# Patient Record
Sex: Female | Born: 1985 | Race: Black or African American | Hispanic: No | Marital: Married | Smoking: Never smoker
Health system: Southern US, Community
[De-identification: ages and names within clinical notes are randomized; demographics above are authoritative.]

## PROBLEM LIST (undated history)

## (undated) ENCOUNTER — Inpatient Hospital Stay (HOSPITAL_COMMUNITY): Payer: Private Health Insurance - Indemnity

## (undated) ENCOUNTER — Ambulatory Visit: Admission: EM | Discharge status: Absent without leave | Payer: BC Managed Care – PPO

## (undated) DIAGNOSIS — Z841 Family history of disorders of kidney and ureter: Secondary | ICD-10-CM

## (undated) DIAGNOSIS — Z823 Family history of stroke: Secondary | ICD-10-CM

## (undated) DIAGNOSIS — N809 Endometriosis, unspecified: Secondary | ICD-10-CM

## (undated) DIAGNOSIS — Z820 Family history of epilepsy and other diseases of the nervous system: Secondary | ICD-10-CM

## (undated) DIAGNOSIS — J45909 Unspecified asthma, uncomplicated: Secondary | ICD-10-CM

## (undated) DIAGNOSIS — Z8619 Personal history of other infectious and parasitic diseases: Secondary | ICD-10-CM

## (undated) DIAGNOSIS — Z8349 Family history of other endocrine, nutritional and metabolic diseases: Secondary | ICD-10-CM

## (undated) DIAGNOSIS — Z8249 Family history of ischemic heart disease and other diseases of the circulatory system: Secondary | ICD-10-CM

## (undated) DIAGNOSIS — R490 Dysphonia: Secondary | ICD-10-CM

## (undated) DIAGNOSIS — Z87440 Personal history of urinary (tract) infections: Secondary | ICD-10-CM

## (undated) DIAGNOSIS — I82401 Acute embolism and thrombosis of unspecified deep veins of right lower extremity: Secondary | ICD-10-CM

## (undated) DIAGNOSIS — K759 Inflammatory liver disease, unspecified: Secondary | ICD-10-CM

## (undated) DIAGNOSIS — Z8742 Personal history of other diseases of the female genital tract: Secondary | ICD-10-CM

## (undated) DIAGNOSIS — Z809 Family history of malignant neoplasm, unspecified: Secondary | ICD-10-CM

## (undated) DIAGNOSIS — Z8709 Personal history of other diseases of the respiratory system: Secondary | ICD-10-CM

## (undated) DIAGNOSIS — B379 Candidiasis, unspecified: Secondary | ICD-10-CM

## (undated) DIAGNOSIS — Z833 Family history of diabetes mellitus: Secondary | ICD-10-CM

## (undated) DIAGNOSIS — Z87442 Personal history of urinary calculi: Secondary | ICD-10-CM

## (undated) DIAGNOSIS — IMO0002 Reserved for concepts with insufficient information to code with codable children: Secondary | ICD-10-CM

## (undated) DIAGNOSIS — Z87448 Personal history of other diseases of urinary system: Secondary | ICD-10-CM

## (undated) DIAGNOSIS — R200 Anesthesia of skin: Secondary | ICD-10-CM

## (undated) HISTORY — DX: Personal history of urinary (tract) infections: Z87.440

## (undated) HISTORY — DX: Personal history of other infectious and parasitic diseases: Z86.19

## (undated) HISTORY — DX: Anesthesia of skin: R20.0

## (undated) HISTORY — PX: PERCUTANEOUS NEPHROSTOMY: SHX2208

## (undated) HISTORY — DX: Family history of stroke: Z82.3

## (undated) HISTORY — DX: Endometriosis, unspecified: N80.9

## (undated) HISTORY — DX: Personal history of other diseases of urinary system: Z87.448

## (undated) HISTORY — DX: Personal history of other diseases of the female genital tract: Z87.42

## (undated) HISTORY — DX: Unspecified asthma, uncomplicated: J45.909

## (undated) HISTORY — DX: Family history of epilepsy and other diseases of the nervous system: Z82.0

## (undated) HISTORY — DX: Dysphonia: R49.0

## (undated) HISTORY — DX: Candidiasis, unspecified: B37.9

## (undated) HISTORY — DX: Family history of diabetes mellitus: Z83.3

## (undated) HISTORY — DX: Inflammatory liver disease, unspecified: K75.9

## (undated) HISTORY — DX: Family history of malignant neoplasm, unspecified: Z80.9

## (undated) HISTORY — DX: Reserved for concepts with insufficient information to code with codable children: IMO0002

## (undated) HISTORY — DX: Family history of other endocrine, nutritional and metabolic diseases: Z83.49

## (undated) HISTORY — DX: Family history of disorders of kidney and ureter: Z84.1

## (undated) HISTORY — DX: Acute embolism and thrombosis of unspecified deep veins of right lower extremity: I82.401

## (undated) HISTORY — PX: WISDOM TOOTH EXTRACTION: SHX21

## (undated) HISTORY — DX: Personal history of other diseases of the respiratory system: Z87.09

## (undated) HISTORY — DX: Family history of ischemic heart disease and other diseases of the circulatory system: Z82.49

---

## 2003-09-29 DIAGNOSIS — Z8742 Personal history of other diseases of the female genital tract: Secondary | ICD-10-CM

## 2003-09-29 HISTORY — DX: Personal history of other diseases of the female genital tract: Z87.42

## 2003-12-23 ENCOUNTER — Emergency Department (HOSPITAL_COMMUNITY): Admission: AD | Admit: 2003-12-23 | Discharge: 2003-12-23 | Payer: Self-pay | Admitting: Internal Medicine

## 2003-12-25 ENCOUNTER — Emergency Department (HOSPITAL_COMMUNITY): Admission: EM | Admit: 2003-12-25 | Discharge: 2003-12-25 | Payer: Self-pay | Admitting: Family Medicine

## 2004-07-04 ENCOUNTER — Other Ambulatory Visit: Admission: RE | Admit: 2004-07-04 | Discharge: 2004-07-04 | Payer: Self-pay | Admitting: Obstetrics and Gynecology

## 2004-10-21 ENCOUNTER — Emergency Department (HOSPITAL_COMMUNITY): Admission: EM | Admit: 2004-10-21 | Discharge: 2004-10-21 | Payer: Self-pay | Admitting: Family Medicine

## 2004-10-30 ENCOUNTER — Emergency Department (HOSPITAL_COMMUNITY): Admission: EM | Admit: 2004-10-30 | Discharge: 2004-10-30 | Payer: Self-pay | Admitting: Family Medicine

## 2005-02-08 ENCOUNTER — Emergency Department (HOSPITAL_COMMUNITY): Admission: EM | Admit: 2005-02-08 | Discharge: 2005-02-08 | Payer: Self-pay | Admitting: Emergency Medicine

## 2005-09-04 ENCOUNTER — Emergency Department (HOSPITAL_COMMUNITY): Admission: EM | Admit: 2005-09-04 | Discharge: 2005-09-04 | Payer: Self-pay | Admitting: Emergency Medicine

## 2005-09-28 DIAGNOSIS — Z8709 Personal history of other diseases of the respiratory system: Secondary | ICD-10-CM

## 2005-09-28 DIAGNOSIS — I82401 Acute embolism and thrombosis of unspecified deep veins of right lower extremity: Secondary | ICD-10-CM

## 2005-09-28 DIAGNOSIS — IMO0002 Reserved for concepts with insufficient information to code with codable children: Secondary | ICD-10-CM

## 2005-09-28 HISTORY — DX: Personal history of other diseases of the respiratory system: Z87.09

## 2005-09-28 HISTORY — DX: Acute embolism and thrombosis of unspecified deep veins of right lower extremity: I82.401

## 2005-09-28 HISTORY — DX: Reserved for concepts with insufficient information to code with codable children: IMO0002

## 2005-12-07 ENCOUNTER — Emergency Department (HOSPITAL_COMMUNITY): Admission: EM | Admit: 2005-12-07 | Discharge: 2005-12-07 | Payer: Self-pay | Admitting: *Deleted

## 2006-03-25 ENCOUNTER — Other Ambulatory Visit: Admission: RE | Admit: 2006-03-25 | Discharge: 2006-03-25 | Payer: Self-pay | Admitting: Obstetrics and Gynecology

## 2006-04-21 ENCOUNTER — Inpatient Hospital Stay (HOSPITAL_COMMUNITY): Admission: AD | Admit: 2006-04-21 | Discharge: 2006-04-22 | Payer: Self-pay | Admitting: Obstetrics and Gynecology

## 2006-04-29 ENCOUNTER — Inpatient Hospital Stay (HOSPITAL_COMMUNITY): Admission: AD | Admit: 2006-04-29 | Discharge: 2006-05-18 | Payer: Self-pay | Admitting: Obstetrics and Gynecology

## 2006-04-30 ENCOUNTER — Encounter (INDEPENDENT_AMBULATORY_CARE_PROVIDER_SITE_OTHER): Payer: Self-pay | Admitting: Specialist

## 2006-04-30 ENCOUNTER — Ambulatory Visit: Payer: Self-pay | Admitting: Pulmonary Disease

## 2006-04-30 ENCOUNTER — Ambulatory Visit: Payer: Self-pay | Admitting: Cardiology

## 2006-05-03 ENCOUNTER — Encounter: Payer: Self-pay | Admitting: General Surgery

## 2006-05-20 ENCOUNTER — Ambulatory Visit: Payer: Self-pay | Admitting: Cardiology

## 2006-05-27 ENCOUNTER — Ambulatory Visit: Payer: Self-pay | Admitting: Cardiology

## 2006-05-29 DIAGNOSIS — Z8619 Personal history of other infectious and parasitic diseases: Secondary | ICD-10-CM

## 2006-05-29 HISTORY — DX: Personal history of other infectious and parasitic diseases: Z86.19

## 2006-06-07 ENCOUNTER — Ambulatory Visit: Payer: Self-pay | Admitting: Emergency Medicine

## 2006-06-10 ENCOUNTER — Ambulatory Visit: Payer: Self-pay | Admitting: Cardiology

## 2006-06-24 ENCOUNTER — Ambulatory Visit: Payer: Self-pay | Admitting: Cardiology

## 2006-07-08 ENCOUNTER — Ambulatory Visit: Payer: Self-pay | Admitting: Cardiology

## 2006-07-15 ENCOUNTER — Ambulatory Visit: Payer: Self-pay | Admitting: Emergency Medicine

## 2006-07-16 ENCOUNTER — Ambulatory Visit: Payer: Self-pay | Admitting: Cardiology

## 2006-08-02 ENCOUNTER — Ambulatory Visit: Payer: Self-pay

## 2006-08-02 ENCOUNTER — Ambulatory Visit: Payer: Self-pay | Admitting: Internal Medicine

## 2006-08-02 ENCOUNTER — Encounter: Payer: Self-pay | Admitting: Internal Medicine

## 2006-08-16 ENCOUNTER — Ambulatory Visit: Payer: Self-pay | Admitting: Emergency Medicine

## 2006-08-23 ENCOUNTER — Ambulatory Visit: Payer: Self-pay | Admitting: Cardiology

## 2006-09-24 ENCOUNTER — Ambulatory Visit: Payer: Self-pay | Admitting: Cardiology

## 2006-09-29 ENCOUNTER — Ambulatory Visit: Payer: Self-pay | Admitting: Internal Medicine

## 2006-10-08 ENCOUNTER — Ambulatory Visit: Payer: Self-pay | Admitting: Internal Medicine

## 2006-10-29 ENCOUNTER — Ambulatory Visit: Payer: Self-pay | Admitting: Internal Medicine

## 2006-11-19 ENCOUNTER — Ambulatory Visit: Payer: Self-pay | Admitting: Internal Medicine

## 2006-11-22 ENCOUNTER — Ambulatory Visit: Payer: Self-pay | Admitting: Emergency Medicine

## 2007-05-11 ENCOUNTER — Emergency Department (HOSPITAL_COMMUNITY): Admission: EM | Admit: 2007-05-11 | Discharge: 2007-05-11 | Payer: Self-pay | Admitting: Family Medicine

## 2007-05-24 DIAGNOSIS — R200 Anesthesia of skin: Secondary | ICD-10-CM

## 2007-05-24 HISTORY — DX: Anesthesia of skin: R20.0

## 2007-06-10 ENCOUNTER — Ambulatory Visit: Payer: Self-pay | Admitting: Vascular Surgery

## 2007-07-27 DIAGNOSIS — I82409 Acute embolism and thrombosis of unspecified deep veins of unspecified lower extremity: Secondary | ICD-10-CM | POA: Insufficient documentation

## 2007-07-27 DIAGNOSIS — J984 Other disorders of lung: Secondary | ICD-10-CM

## 2008-07-17 ENCOUNTER — Inpatient Hospital Stay (HOSPITAL_COMMUNITY): Admission: AD | Admit: 2008-07-17 | Discharge: 2008-07-17 | Payer: Self-pay | Admitting: Obstetrics and Gynecology

## 2008-08-09 ENCOUNTER — Inpatient Hospital Stay (HOSPITAL_COMMUNITY): Admission: AD | Admit: 2008-08-09 | Discharge: 2008-08-09 | Payer: Self-pay | Admitting: Obstetrics and Gynecology

## 2008-08-19 ENCOUNTER — Inpatient Hospital Stay (HOSPITAL_COMMUNITY): Admission: AD | Admit: 2008-08-19 | Discharge: 2008-08-19 | Payer: Self-pay | Admitting: Obstetrics and Gynecology

## 2008-09-13 ENCOUNTER — Inpatient Hospital Stay (HOSPITAL_COMMUNITY): Admission: AD | Admit: 2008-09-13 | Discharge: 2008-09-13 | Payer: Self-pay | Admitting: Obstetrics and Gynecology

## 2008-10-19 ENCOUNTER — Inpatient Hospital Stay (HOSPITAL_COMMUNITY): Admission: RE | Admit: 2008-10-19 | Discharge: 2008-10-21 | Payer: Self-pay | Admitting: Obstetrics and Gynecology

## 2008-12-12 LAB — CONVERTED CEMR LAB: Pap Smear: NORMAL

## 2009-09-03 ENCOUNTER — Ambulatory Visit: Payer: Self-pay | Admitting: Family

## 2009-09-03 DIAGNOSIS — K5289 Other specified noninfective gastroenteritis and colitis: Secondary | ICD-10-CM | POA: Insufficient documentation

## 2009-09-03 DIAGNOSIS — R51 Headache: Secondary | ICD-10-CM | POA: Insufficient documentation

## 2009-09-03 DIAGNOSIS — J45909 Unspecified asthma, uncomplicated: Secondary | ICD-10-CM | POA: Insufficient documentation

## 2009-09-03 DIAGNOSIS — R519 Headache, unspecified: Secondary | ICD-10-CM | POA: Insufficient documentation

## 2009-09-03 DIAGNOSIS — J309 Allergic rhinitis, unspecified: Secondary | ICD-10-CM | POA: Insufficient documentation

## 2010-09-10 ENCOUNTER — Telehealth: Payer: Self-pay | Admitting: Family

## 2010-10-19 ENCOUNTER — Encounter: Payer: Self-pay | Admitting: Obstetrics and Gynecology

## 2010-10-30 NOTE — Progress Notes (Signed)
  Phone Note Outgoing Call   Call placed by: Lemont Fillers FNP,  September 10, 2010 9:35 AM Call placed to: Patient Summary of Call: Called patient to follow up on her acute gastroenteritis. She states that she is feeling much better. Initial call taken by: Lemont Fillers FNP,  September 10, 2010 9:36 AM

## 2011-01-12 LAB — CBC
HCT: 29.9 % — ABNORMAL LOW (ref 36.0–46.0)
HCT: 34.5 % — ABNORMAL LOW (ref 36.0–46.0)
MCHC: 33.8 g/dL (ref 30.0–36.0)
MCV: 90.3 fL (ref 78.0–100.0)
Platelets: 152 10*3/uL (ref 150–400)
Platelets: 174 10*3/uL (ref 150–400)
RDW: 13.3 % (ref 11.5–15.5)
RDW: 13.3 % (ref 11.5–15.5)
WBC: 14.3 10*3/uL — ABNORMAL HIGH (ref 4.0–10.5)
WBC: 8.2 10*3/uL (ref 4.0–10.5)

## 2011-01-12 LAB — URINE CULTURE
Colony Count: NO GROWTH
Special Requests: NEGATIVE

## 2011-01-12 LAB — RH IMMUNE GLOB WKUP(>/=20WKS)(NOT WOMEN'S HOSP)

## 2011-01-12 LAB — RPR: RPR Ser Ql: NONREACTIVE

## 2011-02-10 NOTE — Consult Note (Signed)
NEW PATIENT CONSULTATION   DIANY, FORMOSA  DOB:  [birth year]                                       06/10/2007  ZOXWR#:[id number]   The patient presents today for evaluation of bilateral lower extremity  numbness.  She is a very pleasant 25 year old female who had a history  of DVT with complications surrounding the time of complications from  delivery of a preterm infant and secondary sepsis.  She had a negative  hypercoagulable workup at that time.  She reports having bilateral leg  numbness mostly around the time of menses.  She does not have any  swelling.  I do not have documentation of which leg had the DVT, but she  has been told that this was left leg, although  clinic notes from  the Coumadin clinic suggest right leg DVT.  She has had no prior  thrombotic events, and no thrombotic events since her severe illness  around the time of her delivery.  She has no other major medical  difficulties, and no history of premature atherosclerotic disease or  thrombotic events in her family.  She is single.  Works as a Public librarian.  Does not smoke or drink or alcohol.   REVIEW OF SYSTEMS:  Positive for bronchitis and wheezing.   PHYSICAL EXAMINATION:  She is a well-developed, thin black female,  appearing stated age of 48.  Blood pressure is 127/80, pulse 83,  respirations 16.  Her radial and dorsalis pedis pulses are 2+  bilaterally.  She does not have any evidence of swelling or evidence of  chronic venous insufficiency, and no skin changes.  She has normal  sensation currently.   She underwent noninvasive vascular laboratory studies in our office.  This showed no evidence of DVT.  No evidence of superficial  thrombophlebitis.  She does have mild reflux in her right great  saphenous vein.  I discussed this at length with [name].  I explained  that unfortunately she does not have any evidence of postphlebitic  syndrome or any evidence  of vein injury following her deep venous  thrombosis.  I explained that this should indicate no major risk for  history of postphlebitic syndrome in the future.  I am unclear it is  etiology of her numbness, but do not feel that there is any relationship  to any vascular or arterial pathology.  She was relieved with this  discussion, and will see [place] again on an as needed basis.   Larina Earthly, M.D.  Electronically Signed   TFE/MEDQ  D:  06/10/2007  T:  06/13/2007  Job:  [id number]   cc:   Hal Morales, M.D.

## 2011-02-10 NOTE — H&P (Signed)
NAMESYENNA, NAZIR                 ACCOUNT NO.:  [id number]   MEDICAL RECORD NO.:  [id number]          PATIENT TYPE:  INP   LOCATION:  9162                          FACILITY:  [facility]   PHYSICIAN:  Hal Morales, M.D.DATE OF BIRTH:  1985-11-21   DATE OF ADMISSION:  10/19/2008  DATE OF DISCHARGE:                              HISTORY & PHYSICAL   [name] is a 25 year old G2, P0-1-0-0 at 39 weeks and 5 days who  presents for induction of labor per [name].  She is followed by the  doctors at [facility].  Her pregnancy is remarkable for:   1. Rh negative.  2. Amoxicillin allergy.  3. Intrauterine fetal demise at 21 weeks secondary to septic shock.  4. Sepsis and renal failure with last pregnancy.  5. History of DVT, right lower extremity while hospitalized with      septic shock.  6. Anemia.  7. Childhood asthma.  She did receive H1N1 vaccine.  8. Low BMI with a total weight gain of 20 pounds this pregnancy.   PRENATAL LABS:  Include hemoglobin 11.1, hematocrit 33.4, platelets 258.  Blood type A negative.  Antibody screen negative.  Sickle cell trait  negative, RPR nonreactive, rubella immune, hepatitis B negative, HIV  nonreactive.  Negative multiple urine cultures.  There is no Pap on  record.  Chlamydia and gonorrhea negative on multiple cultures.  GBS  negative.  Cystic fibrosis screen negative.  Her Glucola screen was  negative and she had a negative F F in November.   HISTORY OF PRESENT PREGNANCY:  Ms [name] presented for her new OB  evaluation in early July at about 10-1/2 weeks.  At 13-1/2 weeks she had  a first trimester screen which was normal and was treated for yeast  infection with Terazol cream.  At 17-1/2 weeks she had an AFP which was  normal and negative Chem 10 on her urine.  At 19 weeks she was  complaining of calf pain and shortness of breath, had a normal exam per  Dr. Stefano Gaul.  She had an ultrasound which showed a single intrauterine  pregnancy, cervix 3.99 cm  long, normal anatomy and normal fluid.  She  had a gastrointestinal illness at 20 weeks for which she took some  Imodium.  At 21-1/2 weeks her leg pain was resolved.  A urine was sent  off for culture.  At 25-1/2 weeks she had a Glucola which was normal and  was found to have a hemoglobin of 10.2 at that time and was changed over  to prenatal vitamin with ferrous gluconate and iron supplementation  twice a day.  She was educated on iron-rich foods.  At 34 weeks she had  evaluation for SROM, which was negative; an ultrasound which showed  estimated fetal weight 5 pounds 2 ounces in the 57th percentile with  normal fluid.  Also at 34 weeks she got another episode of  gastroenteritis with complaints of diarrhea, another negative SROM 3  days after the first SROM evaluation, negative pooling, negative  Nitrazine, negative fern.  Cervix was closed and long, -3  posterior.  She did have a reactive NST and was contracting.  She was sent to the  hospital for terbutaline and monitoring and that was on December 17.  She got her RhoGAM and H1N1 vaccine at 27 weeks and she had a negative  fetal fibronectin test at 29 weeks.  At 36-5 weeks she had negative  Chlamydia, gonorrhea and GBS cultures, a negative urine culture.  At 37  weeks she had an upper respiratory infection which she treated with  Sudafed Delsym, saline rinses and Tylenol and was also given a  prescription for a Z-Pak.  She had a reactive NST that  week.  The last  2 weeks of her pregnancy have been unremarkable when she has been well.   CURRENT MEDICATIONS INCLUDE:  Flintstone vitamin daily as she cannot  tolerate the prenatals, and iron 2x daily.  She does have an inhaler,  Ventolin HFA,which she uses p.r.n., last use reported 3 weeks ago.   OB HISTORY:  Rhett Bannister 1 was an IUFD at 90 weeks in August, 2007, related  to septic shock.  The patient was in renal failure and on a ventilator.  Gravida 2 is current pregnancy.   ALLERGIES:   [name] is allergic AMOXICILLIN.   MEDICAL HISTORY:  The previously mentioned sepsis, renal failure, which  also involved a DVT and a fetal loss at 21 weeks.  She is A-negative for  blood type.  She has frequent yeast infections.  She did have chicken  pox as a child and the complete hepatitis vaccination series.  DVT,  right lower extremity in 2007.  She has been asthmatic since age 80 or 36.  She has had bronchitis.  She has history of cystitis and pyelonephritis  and resolved acute renal failure.   SURGICAL HISTORY:  Wisdom teeth extraction.   FAMILY HISTORY:  Maternal grandmother deceased secondary to MI.  Dad has  high cholesterol.  Her mom and her maternal grandfather have chronic  hypertension.  Her maternal grandmother has bronchitis, maternal  grandmother has diabetes.  Her maternal grandfather is on dialysis.  Her  maternal grandfather has had a stroke.  Her paternal grandfather had a  brain tumor which he died of.  Her father has a tobacco addiction.   GENETIC HISTORY:  The patient has negative screens for sickle trait and  cystic fibrosis.  The father of the baby has a first cousin with  cerebral palsy.  The patient has paternal aunt with Down syndrome.   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  Father of the baby,  Caprisha Bridgett, has 16-1/2 years of education and works at [employer].  She states her religion is [facility] and then she denies use of alcohol,  tobacco or street drugs during this pregnancy.   PHYSICAL EXAM:  Is normal today.  Afebrile, vital signs stable.  HEENT: Within normal limits.  LUNGS: Clear to auscultation bilaterally.  HEART: Regular rate and rhythm.  No murmurs.  BREASTS:  Soft and nontender.  ABDOMEN:  Gravid, soft to palpation, appropriate for gestational age.  EXTREMITIES: Without any edema.  DTRs within normal limits.  Negative  Homans x2.  Fetal heart rate 135,  reactive and reassuring.  Occasional rare  contractions upon admission.  Vaginal exam per RN Lillia Mountain:  4 cm, 80%  effaced, minus two station, vertex.   IMPRESSION:  A 25 year old G2, P 0-1-0-0  at 39.5 weeks.  History of intrauterine fetal demise, sepsis, renal failure at 21 weeks.  Rh-negative.   PLAN:  Admit for IOL per Pitocin protocol.  Urine for C&S and per orders  [name].  M.D. management.      Eulogio Bear, CNM      Hal Morales, M.D.  Electronically Signed    JM/MEDQ  D:  10/19/2008  T:  10/19/2008  Job:  [id number]

## 2011-02-10 NOTE — Procedures (Signed)
DUPLEX DEEP VENOUS EXAM - LOWER EXTREMITY   INDICATION:  Previous DVT in the right leg.   HISTORY:  Edema:  Mild bilateral lower extremity edema.  Trauma/Surgery:  The patient was on a respirator for a period of time  last year.  Pain:  The patient complains of bilateral leg numbness during her cycle.  PE:  No.  Previous DVT:  The patient had a lower extremity DVT in her right leg  last year, however, that report was unavailable.  Anticoagulants:  The patient was on Coumadin for six months last year.  She stopped taking Coumadin in February 2008.   DUPLEX EXAM:                CFV   SFV   PopV  PTV    GSV                R  L  R  L  R  L  R   L  R  L  Thrombosis    o  o  o  o  o  o  o   o  o  o  Spontaneous   +  +  +  +  +  +  +   +  +  +  Phasic        +  +  +  +  +  +  +   +  +  +  Augmentation  +  +  +  +  +  +  +   +  +  +  Compressible  +  +  +  +  +  +  +   +  +  +  Competent     +  +  +  +  +  +  +   +  P. +.   Legend:  + - yes  o - no  p - partial  D - decreased   IMPRESSION:  No evidence of a DVT, SVT or Baker's cyst bilaterally.   Mild reflux was seen in the right greater saphenous vein.    _____________________________  Larina Earthly, M.D.   MC/MEDQ  D:  06/10/2007  T:  06/11/2007  Job:  [id number]

## 2011-02-13 NOTE — Assessment & Plan Note (Signed)
[facility]                               PULMONARY OFFICE NOTE   JAZZLIN, CLEMENTS                        MRN:          [id number]  DATE:08/16/2006                            DOB:          1986-02-19    FOLLOWUP VISIT:   SUBJECTIVE:  [name] is a pleasant 25 year old woman who has a history of  a critical care illness in August of 2007 with septic shock complicated by  ARDS.  She also had a right lower extremity DVT, and has been on Coumadin  since that hospitalization.  She follows up today stating that she is  breathing fine.  She has no complaints.  She has been compliant with her  Coumadin, and it has been in the therapeutic range.  She gets her followup  at the [facility].   MEDICATIONS:  Coumadin 5 mg on Wednesdays and 7.5 mg on all other days.   PHYSICAL EXAMINATION:  GENERAL:  This is a pleasant young woman who is in no  distress on room air.  VITAL SIGNS:  Weight is 101 pounds, temperature 98.0, blood pressure 108/72,  heart rate 80, SpO2 of 98% on room air.  HEENT:  Benign.  Her hoarse voice has resolved.  LUNGS:  Clear to auscultation bilaterally.  HEART:  Regular rate and rhythm without murmur.  ABDOMEN:  Soft, benign, with positive bowel sounds.  EXTREMITIES:  No clubbing, cyanosis, or edema.   Transthoracic echocardiogram was performed on August 02, 2006.  This showed  normal left ventricular size and function with an estimated ejection  fraction of 65%.  Left ventricular wall thickness was normal.  The right-  sided function was normal, as well.  This was regarded as an improvement  compared with her hospitalization echocardiogram and shows no evidence of  postpartum or sepsis cardiomyopathy.  Chest x-ray performed today was within  normal limits without any evidence of infiltrate or effusion.  Her heart  shadow was normal in appearance.   IMPRESSION:  1. History of acute respiratory distress syndrome and  septic shock and      pyelonephritis in the setting of a 21-week intrauterine pregnancy, now      fully recovered.  2. Right lower extremity deep vein thrombosis on Coumadin.  She will need      to be on Coumadin for a total of 6 months.  3. No evidence of postpartum or septic shock related cardiomyopathy on      echocardiogram.  4. Intermittent hoarse voice status post intubation which has resolved.   PLANS:  1. [name] will continue her Coumadin until mid February 2008.  2. I will followup with [name] in February 2008 to discontinue her      Coumadin.     Leslye Peer, MD  Electronically Signed    RSB/MedQ  DD: 08/16/2006  DT: 08/16/2006  Job #: [id number]   cc:   Hal Morales, M.D.

## 2011-02-13 NOTE — Assessment & Plan Note (Signed)
[facility]                               PULMONARY OFFICE NOTE   AAYLA, MARROCCO                        MRN:          [id number]  DATE:06/07/2006                            DOB:          May 20, 1986    BRIEF HISTORY:  [name] is a 25 year old woman without any significant  past medical history who was admitted to [facility] in early August  2007, [redacted] weeks pregnant.  She was noted to have a urinary tract infection  with a sensitive E coli.  She, unfortunately, developed profound septic  shock, ARDS and respiratory failure, resulting in fetal demise. She was  aggressively resuscitated and moved to [facility]  for further  care.  She developed oliguric renal failure requiring hemodialysis in  addition to mechanical ventilation and hemodynamic support with multiple  pressors.  She slowly improved and these support mechanisms were able to be  weaned off.  Her hospital course was complicated by a right femoral DVT for  which she was started on low molecular weight heparin and then Coumadin. She  continues on Coumadin at this time.  There was also some question of  decreased left ventricular function during the hospitalization which was  ascribed to either mild cardiomyopathy associated with pregnancy versus  cardiomyopathy of septic shock.  She was discharged to home on May 18, 2006.  This is her first followup visit.   PAST MEDICAL HISTORY:  None.   ALLERGIES:  AMOXICILLIN.   CURRENT MEDICATIONS:  1. Coumadin as directed per the [facility].  2. Protonix 40 mg p.o. daily.   SOCIAL HISTORY:  The patient lives in [place].  She is a Consulting civil engineer at [employer].  Her parents have been involved in her care during the  hospitalization.  She does not smoke or use alcohol.   FAMILY HISTORY:  Noncontributory.   PHYSICAL EXAMINATION:  GENERAL:  This is a pleasant, thin woman who is in no  distress on room air.  VITAL SIGNS:  Weight 102 pounds, temperature 97.9, blood pressure 104/62,  heart rate 97, SpO2 100% on room air.  NECK:  Supple without lymphadenopathy.  No stridor.  HEENT:  The oropharynx is within normal limits.  Pupils equal, round and  reactive to light and accommodation.  LUNGS:  Clear to auscultation bilaterally with good diaphragmatic excursion  and no dullness to percussion.  HEART:  Regular rate and rhythm without murmur.  She is borderline  tachycardic.  ABDOMEN:  Is soft, nontender, nondistended with positive bowel sounds.  EXTREMITIES:  No cyanosis, clubbing or edema.  No cords are palpable.  Homan's sign is negative.  NEUROLOGIC:  Alert and oriented x3.  Interacts appropriately.  Has grossly  nonfocal exam.   IMPRESSION:  1. Status post critical care hospitalization due to septic shock, urinary      tract infection and adult respiratory distress syndrome.  2. History of acute renal failure, now resolved.  3. Right lower extremity deep venous thrombosis on Coumadin therapy.  4. Questionable cardiomyopathy of pregnancy versus cardiomyopathy of  septic shock.   PLAN:  1. Discontinue Protonix.  2. Continue Coumadin for a total of six months per [facility].  3. Transesophageal echocardiogram will be performed in two months to      insure that her left ventricular function has returned to normal.  If      she continues to have decreased left ventricular function, then it      would probably be appropriate to refer her to cardiology for further      surveillance.  4. A PA and lateral chest x-ray next visit to insure that her pulmonary      infiltrates remain improved.  5. Follow up in two months or sooner should she have any difficulties.                                   Leslye Peer, MD   RSB/MedQ  DD:  06/11/2006  DT:  06/12/2006  Job #:  [id number]   cc:   Hal Morales, M.D.

## 2011-02-13 NOTE — H&P (Signed)
[name], [name]                 ACCOUNT NO.:  [id number]   MEDICAL RECORD NO.:  [id number]          PATIENT TYPE:  INP   LOCATION:  9305                          [facility]ACILITY:  [facility]   PHYSICIAN:  Janine Limbo, M.D.DATE OF BIRTH:  05/23/1986   DATE OF ADMISSION:  04/29/2006  DATE OF DISCHARGE:                                HISTORY & PHYSICAL   [name] is a 25 year old, gravida 1, para 0, who is admitted at 21-3/7th  weeks gestation secondary to an elevated temperature, CVA tenderness, and an  elevated white blood cell count. These are thought to be  consistent with  pyelonephritis.  The patient was seen at [facility] office on the day of admission  and was noted to have mild CVA tenderness but no fever.  The patient was  discharged home on antibiotics and urine culture and sensitivity are pending  from that office visit.  The patient denies dysuria but some pressure with  urination.  The patient reports that she had onset of fever on April 28, 2006 and again this afternoon April 29, 2006 with a temperature of 102  degrees.  The patient reports no vomiting but slight nausea.  The patient  denies any constipation or diarrhea.  The patient denies any cramping,  leakage of fluid, and bleeding.  The patient reports that she is feeling her  fetus move normally.  The patient was treated for UTI secondary to E. coli  March 31, 2006 at which time she completed treatment.  The patient has also  been evaluated at maternity admissions on April 21, 2006 for abdominal pain  at which time urine C&S was sent without growth.  The patient was treated  for bacterial vaginosis at that time; however, she did not use her  medications and she had negative gonorrhea and Chlamydia cultures at that  time.  The patient reports that she has been using ibuprofen for her pain  and fever as well.  Ultrasound was done at [facility] on April 22, 2006 with the  cervix 3.2 cm and normal amniotic fluid.  The patient denies  abdominal pain  currently but just back pain in her mid to lower back.  The patient's  pregnancy was remarkable for #1) decreased BMI, #2) history of asthma, #3)  abnormal last menstrual period, #4) Rh negative, #5) late entry to care.   PRENATAL LABS:  Initial hemoglobin 10.4, hematocrit 32.1, platelets 271,000.  A blood type A negative, antibody screen negative, RPR nonreactive.  Rubella  titer immuned.  Hepatitis B surface antigen negative.  HIV nonreactive.  Pap  smear within normal limits.  Gonorrhea and Chlamydia cultures negative.  Cystic fibrosis testing negative.  Quadruple screen negative.   HISTORY OF PRESENT PREGNANCY:  The patient entered care at approximately [redacted]  weeks gestation.  The patient's pregnancy has been followed by doctor's  service at [facility].  Ultrasound performed at the initial OB  visit revealed fetus consistent with 16 weeks and 4 days.  At that time  urine culture was sent which returned with E. coli sensitive to Macrobid  for  which the patient was treated with Macrobid b.i.d. for seven days.  The  patient underwent ultrasound for anatomy and was found to be normal.  Quad  screen was also normal.  Other than the above-mention visit for MAU and  current symptoms, the patient's pregnancy has been unremarkable.   OB HISTORY:  Pregnancy #1 is current.   GYN HISTORY:  The patient denies any history of abnormal Paps or STDs.  The  patient reports occasional yeast infections.  The patient reports use of  birth control pills approximately three years ago, however, none recently.   PAST MEDICAL HISTORY:  The patient with a history of asthmatic bronchitis at  45 to 25 years old.  However, no other recent problems and uses no medications  at the current time.  The patient with history of bladder infection x1 prior  to pregnancy.  The patient with no other urological history.   CURRENT MEDICATIONS:  1. Prenatal vitamins 1 daily.  2. Ferrous sulfate 1  tablet daily.  3. Tylenol p.r.n.  4. Ibuprofen p.r.n.  5. Macrobid 1 capsule b.i.d.   ALLERGIES:  AMOXICILLIN causes a rash.   PAST SURGICAL HISTORY:  Wisdom teeth x4.  Otherwise negative.   FAMILY HISTORY:  Maternal grandmother heart disease, bronchitis, diabetes.  Mother hypertension.  Father hypertension.  Maternal grandfather  hypertension, renal disease, CVA.  Paternal grandfather brain tumor.   GENETIC HISTORY:  Father of the baby's first cousin with cerebral palsy.  The patient's aunt with Down's syndrome.  Otherwise negative.   SOCIAL HISTORY:  The patient is single.  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  Father of the baby, [name], is involved and supportive.  He works as a Geophysical data processor with 16-1/2 years of education.  The patient denies use of  alcohol, tobacco or street drugs.  The patient's domestic violence screen is  negative.   PHYSICAL EXAMINATION:  VITAL SIGNS:  The patient is afebrile at present.  Temperature 98.3, blood pressure 80/46, heart rate 129.  HEENT:  Within normal limits.  LUNGS:  Clear.  HEART:  Regular rate and rhythm.  BREASTS:  Soft.  ABDOMEN:  Soft, gravid and nontender with positive bowel sounds x4  quadrants.  No rebound or guarding is noted.  No masses are appreciated.  The patient with mild right CVA tenderness and negative left CVA tenderness.  Uterus is soft and nontender consistent with [redacted] weeks gestation.  Fetal  heart tones are noted to be present in the 160s.  PELVIC EXAM:  Deferred.  EXTREMITIES:  Negative edema bilaterally.  Negative Homans' bilaterally.  Deep tendon reflexes are 2+.  No clonus.   LABORATORY DATA:  WBC 41,000, hemoglobin 10.0, hematocrit 29.2, platelets  340,000.  Neutrophils 95%, lymphocytes 2% and greater than 20% bands noted.  Clean catch urine obtained on admission revealed a specific gravity of 1.020, trace of hemoglobin, small bilirubin, 15  mg/dL of ketones, 30 mg/dL  of protein, 0.2 mg/dL of urobilinogen, negative nitrites, large leukocytes,  WBCs too numerous to count, RBCs 3 to 6, bacteria too numerous to count,  occasional yeast and positive mucus.   ASSESSMENT:  1. Intrauterine pregnancy at 21+ weeks.  2. Pyelonephritis.   PLAN:  Consult obtained with Dr. Stefano Gaul.  The patient will be admitted to  third floor for IV antibiotics and further observation.  Doctor is to  follow.  The patient will be started on Clindamycin  and gentamycin secondary  to penicillin allergy.      Rhona Leavens, CNM      Janine Limbo, M.D.  Electronically Signed    NOS/MEDQ  D:  04/30/2006  T:  04/30/2006  Job:  [id number]

## 2011-02-13 NOTE — Consult Note (Signed)
[name], [name]                 ACCOUNT NO.:  [id number]   MEDICAL RECORD NO.:  [id number]          PATIENT TYPE:  INP   LOCATION:  2110                         FACILITY:  [facility]   PHYSICIAN:  Sharlet Salina T. Hoxworth, M.D.DATE OF BIRTH:  [birth year]   DATE OF CONSULTATION:  04/30/2006  DATE OF DISCHARGE:                                   CONSULTATION   REASON FOR CONSULTATION:  Ascites with right-sided abdominal and back pain  and rule out sepsis.   HISTORY OF PRESENT ILLNESS:  This is a 25 year old gravid female, [redacted] weeks  gestation who had a UTI  in early July, cultures were positive for E. coli.  She had recurrent symptoms in late July.  Followed up with [name] at  that time.  She did not have any growth in her urine but was diagnosed with  bacterial vaginosis.  Unfortunately, she did not complete or take her course  of medication.  She apparently began developing UTI symptoms and again was  admitted to [facility] by [name] on April 29, 2006 with a  urinalysis consistent with a UTI.  She was experiencing CVAT on the right  and was felt to have pyelonephritis.  She was started empirically on  gentamycin and clindamycin.  Early this morning, at about 1:00 the patient  became quite ill with nausea and vomiting, fever greater than 102.  Systolic  blood pressures decreased into the 50s.  She was mildly tachycardic and her  urine output decreased dramatically.  She was transferred by [name] to  the ICU and critical care medicine was consulted.  The patient further  decompensated developing ARDS requiring intubation and now she is on the  ventilator and is now on pressors for blood pressure support.  She has  subsequently been transferred to [facility] ICU.  Because of the initial  presentation consistent with pyelo a renal ultrasound was ordered.  This  showed right hydronephrosis with free fluid consistent with either ascites  or a ruptured calix.  Because of the ascites   and some question from the  admitting physician as to whether the patient may have an appendicitis, Dr.  [name] was called in for surgical consultation.  Dr. Delton Coombes actually spoke  with Dr. [name] via the telephone with Dr. [name] who is in the O.R.  At  this point Dr. [name] does not feel the ascites is anything significant  and in reviewing this patient's symptoms she has not had any right lower  quadrant pain.  She has had a right CVAT and with the changes in the  ultrasound consistent with a unilateral problem, consistent with  pyelonephritis, we felt at this time this could be a major issue with the  patient's septic presentation.  In the interim, a urology consult has been  obtained and orders are pending for interventional radiology to insert a  percutaneous nephrostomy tube.  The patient as noted has been on  azithromycin; for transfer this was stopped because of concerns of  multisystem organ failure and pending renal failure.  She remains on  clindamycin and  has been started on Primaxin.   REVIEW OF SYSTEMS:  Unobtainable.   PAST MEDICAL HISTORY:  Childhood asthmatic bronchitis.   PAST SURGICAL HISTORY:  Unknown.   FAMILY HISTORY:  Positive for coronary artery disease, CAD, CVA, and  diabetes mellitus.   ALLERGIES:  AMOXICILLIN WHICH CAUSES A RASH.   MEDICATIONS:  See the medication reconciliation sheet.  She remains on  1.  Primaxin.  2.  Clindamycin.  3.  Levophed.  4.  Neo-Synephrine.  5.  IV fluids for blood pressure support.   PHYSICAL EXAMINATION:  In general:  This is a sedated young female patient,  unresponsive on the ventilator.  Vital signs:  Temperature 97.3, blood  pressure 120/82, pulse 100 and regular.  Her respiratory effort is supported  by the ventilator.  She remains sedated.  No spontaneous movements.  HEENT:  Head is normocephalic.  Sclerae are not injected.  Mild sclerae edema.  Chest:  Lungs sounds are coarse.  She is on the vent at 100%  of O2.  Cardiac:  S1, S2.  No rubs, murmurs, or gallops.  sinus tachycardia.  She  was on pressors as noted.  Abdomen is round and gravid.  No bowel sounds are  auscultated.  NG is minimal suction with bilious return.  Because of  sedation, I am unable to determine any response to pain.  I did palpate  rather deeply of the lower quadrants of the abdomen without any response.  Genitourinary:  Foley catheter in place with scant amber urine obtained.  Extremities are symmetrical in appearance without edema, cyanosis, or  clubbing.  Pulses are palpable but weak.   LAB:  Urine culture is pending.  Platelet count is 322,000.  INR 1.8.  PT is  12.  PTT 43.7.  Fibrinogen is 545.  D-dimer is 3.26.  Amylase 26, lipase 13,  sodium 132, potassium 3.1, CO2 is 19, glucose 122, BUN 19, creatinine 1.3.  LFTs are normal.  White count is 41,000.  Hemoglobin is 10.  CBC was  actually from April 29, 2006.  CBC is pending for April 30, 2006.  Neutrophils at that time were 95%.  Urinalysis obtained on August 2 is  consistent with UTI with a large amount of leukocytes.   DIAGNOSTICS:  A chest x-ray was done after intubation and shows endotracheal  tube in place.  Edematous changes consistent with ARDS.   IMPRESSION:  1.  Septic shock secondary to pyelonephritis and right hydronephrosis.  2.  VDRL secondary to ARDS.  3.  Profound leukocytosis.  4.  Gravid female, [redacted] weeks gestation.  5.  Acute renal failure.   PLAN:  1.  As noted, the clinical picture at this time is not consistent with acute      appendicitis.  The pattern is more consistent with pyelonephritis and      sepsis thereafter.  Cannot rule out completely a GI source of sepsis as      history and exam are limited at this point but ther is really no      evidence for this. Initial presentation with pain in the right flank      region.  She has had recent urinary tract infection in the past eight     weeks.  She had a positive urinalysis on  admission and that culture is      pending. She has significant leukocytosis with profound fever of greater      than 102.  An ultrasound confirms that there is  free fluid and ascites      much higher up in the abdomen than would be expected with an      appendicitis.  No evidence of acidosis seen with ultrasound but we do      not have a CT to confirm this.  She also has hydronephrosis, again more      consistent with issues of right pyelonephrosis.  2.  We will continue to follow along.  Dr. [name] will make any additional      recommendations as needed.  3.  At the current time the patient is too unstable from a pulmonary and      cardiac standpoint to tolerate either an MRI or CT imaging, or      laparotomy though I do not believe the latter is indicated at present.  4.  Agreed with percutaneous nephrostomy tube.      Allison L. Rennis Harding, N.P.      Lorne Skeens. Hoxworth, M.D.  Electronically Signed    ALE/MEDQ  D:  04/30/2006  T:  05/01/2006  Job:  [id number]

## 2011-02-13 NOTE — Assessment & Plan Note (Signed)
[facility]                               PULMONARY OFFICE NOTE   [name], [name]                        MRN:          [id number]  DATE:07/15/2006                            DOB:          [birth year]    SUBJECTIVE:  [name] is a 25 year old woman who follows up today regarding  her recent critical care hospitalization for septic shock and ARDS.  She has  improved nicely since that hospitalization.  The hospitalization was  complicated by a right lower extremity DVT, and she is on Coumadin for this.  There was also some question of possible left ventricular dysfunction, and  she has an echocardiogram scheduled for the beginning of November.  She is  not having any shortness of breath.  She does complain of some recurrent  hoarseness that she has after she speaks for any extended period of time.  She does not have any sore throat and she rarely coughs.  Her cough, when it  does occur, is non-productive.   MEDICATIONS:  Coumadin, which is being adjusted by the [facility].   PHYSICAL EXAMINATION:  GENERAL:  This is a pleasant, thin woman who is in no  distress.  Her weight is 102.5 pounds.  Temperature 98.0, blood pressure  100/62, heart rate 81, SPOT 98% on room air.  HEENT:  She has no stridor.  Her voice is strong today.  LUNGS:  Clear to auscultation bilaterally.  HEART:  Regular rate and rhythm, without murmur.  ABDOMEN:  Soft, nontender and nondistended; with positive bowel sounds.  EXTREMITIES:  Had no clubbing, cyanosis, clubbing or edema.  He did not have  any palpable cords on either lower extremity.   IMPRESSION:  1. History of ARDS with a history of septic shock, now apparently fully      resolved.  2. Right lower extremity deep venous thrombosis, on Coumadin  3. 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  4. Intermittent hoarse voice.   PLAN:  1.  Continue Coumadin for a total of 6 months.  2. We will repeat a transthoracic echocardiogram in early November, and      then follow up after that test to confirm that her LV function is      intact.  3. If [name] is still having upper airway symptoms in November at our      follow-up visit, then I      will refer her to ENT to have her vocal cords inspected, given her      prolonged intubation on her hospitalization.            ______________________________  Leslye Peer, MD      RSB/MedQ  DD:  07/15/2006  DT:  07/16/2006  Job #:  [id number]   cc:   Hal Morales, M.D.

## 2011-02-13 NOTE — Consult Note (Signed)
[name], [name] NO.:  [id number]   MEDICAL RECORD NO.:  [id number]          PATIENT TYPE:  INP   LOCATION:  2110                         FACILITY:  [facility]   PHYSICIAN:  Valetta Fuller, M.D.  DATE OF BIRTH:  [birth year]   DATE OF CONSULTATION:  DATE OF DISCHARGE:                                   CONSULTATION   REFERRING PHYSICIAN:  Dr. Shan Levans.   REASON FOR CONSULTATION:  Right hydronephrosis with questionable right upper  quadrant fluid collection, frank urosepsis.   HISTORY OF PRESENT ILLNESS:  [name] is a 25 year old female.  She  apparently does not have any known definitive urologic history.  We were  called for consultation this afternoon through Dr. Lynelle Doctor office.  For  that reason, I contacted me to determine the story.  I was told that the  patient was an otherwise generally healthy 25 year old who is currently  approximately 21-22 weeks' pregnant.  She had been admitted apparently to  [facility] on April 29, 2006 with a febrile illness.  She had CVA  tenderness on the right and an elevated white blood cell count and was  admitted with a presumptive diagnosis of acute pyelonephritis.  At that  time, her creatinine was 1.3.  Urinalysis showed too-numerous-to-count white  blood cells with minimal microhematuria and significant bacteria.  Apparently, her clinical situation deteriorated quite rapidly.  She  developed signs and symptoms of frank urosepsis.  She was transferred to  critical care service at [facility].  As part of her assessment,  she had a renal ultrasound.  That was done today at approximately 10 a.m.  This showed what was felt to be mild right hydronephrosis with some free  fluid up in the right upper quadrant.  The radiologist wonder whether this  was ascites or potentially even presented a urinoma due to a ruptured calyx.  There was no evidence of any shadowing, nothing to suggest a renal calculus.  The  patient has evidence of acute lung injury and is frankly uroseptic on  multiple pressors.  Again, we contacted Dr. Delford Field to discuss the situation  with him and certainly, based on the assessment that we made, it appeared  that she did indeed have potentially an obstructed right kidney.  The  etiology that obstruction would be unclear.  It certainly could be  nephrolithiasis, but conceivably she may have had some obstruction simply  secondary to the pregnancy.  Of concern was the fact that there was some  free fluid, suggesting that it is possible that she may have had  forniceal/caliceal rupture with urinoma.  She was clearly floridly  uroseptic.  While the hydro was minimal, given her acute illness, it was  imperative that a good excellent drainage of that kidney be obtained.  We  discussed at length the pros and cons of the percutaneous nephrostomy tube  versus an attempt to try to bring her down to surgery.  We felt that given  the situation at hand, with no coagulopathy and normal platelet counts, that  a percutaneous nephrostomy tube  would be the best approach and that was done  this afternoon.  The patient remains critically ill at this point.  The  patient apparently also was treated for urinary tract infection back  approximately a month ago.  At [facility], again she became severely  hypotensive and had numerous fluid boluses without much improvement in her  blood pressure.  That necessitated the transfer to [facility].  The patient  is currently intubated and no family is available to discuss other issues.  I have carefully reviewed the Pulmonary Critical Care notes as well as some  in the OB notes and the note from the percutaneous nephrostomy tube that was  placed.  The patient in the interim has become oliguric and renal  consultation has also been obtained.   PAST MEDICAL HISTORY:  Apparently notable for asthma.  The patient does not  appear to have any other  significant problems such as diabetes mellitus.   ADMISSION MEDICATIONS:  Included just some vitamin supplements and Macrobid  b.i.d.   ALLERGIES:  She apparently has a PENICILLIN allergy.   REVIEW OF SYSTEMS:  Unobtainable.   PHYSICAL EXAMINATION:  GENERAL:  On exam, she currently is critically,  intubated, fully ventilated in the intensive care unit.  She is currently  afebrile.  VITAL SIGNS:  Currently stable, although the patient is currently receiving  vasopressin, Neo-Synephrine and Levophed.  She is also receiving significant  fluid boluses.  She is currently completely sedated.  ABDOMEN:  Nephrostomy tube is seen draining likely bloody urine.   DATA:  The patient's urinary output has been approximately 30-40 mL/hr.  Urine culture is pending.   ASSESSMENT:  Homero Fellers urosepsis with at least minimal right hydronephrosis.  At  the very least, she has severe acute pyelonephritis with urosepsis.  At this  point, she has had a nephrostomy tube to decompress the kidney and hopefully  source control will be obtained.  Obviously, if she can pull through this  acute illness, then nephrostomy will be necessary to determine whether there  is ongoing obstruction, urinary extravasation or any other pathology.  In  the interim, her management will really depend on Acute Critical Care  Medicine and Obstetrics.  At this point urologically, there is no much for  me to do, other than to continue to periodically see her and to assist in  any way that I can.  Again, assuming that she can get through the acute  illness, then additional issues will need to be addressed such as antegrade  nephrostogram or possible placement of a double-J stent by internalization.           ______________________________  Valetta Fuller, M.D.  Electronically Signed     DSG/MEDQ  D:  04/30/2006  T:  05/01/2006  Job:  [id number]   cc:   Janine Limbo, M.D. Charlcie Cradle Delford Field, MD, FCCP

## 2011-02-13 NOTE — Discharge Summary (Signed)
[name], [name]                 ACCOUNT NO.:  [id number]   MEDICAL RECORD NO.:  [id number]          PATIENT TYPE:  INP   LOCATION:  5732                         FACILITY:  [facility]   PHYSICIAN:  Leslye Peer, MD    DATE OF BIRTH:  10/15/1985   DATE OF ADMISSION:  04/30/2006  DATE OF DISCHARGE:                           DISCHARGE SUMMARY - REFERRING   DISCHARGE DIAGNOSES:  1. Status post severe sepsis, septic shock, secondary to pyelonephritis      and resultant acute respiratory distress syndrome and ventilator      dependent respiratory failure.  2. Right femoral deep venous thrombosis.  3. Cardiomyopathy of pregnancy.   LABORATORY DATA:  May 15, 2006, PT/INR 18.8/1.5.  May 16, 2006, PT/INR  19.8/1.6.  May 17, 2006, PT/INR 24.5/2.1.  May 18, 2006, PT/INR  27.6/2.4.  May 18, 2006, wbc 7.8, hemoglobin 10.8, hematocrit 32,  platelets 319.   MICROBIOLOGY:  April 21, 2006, group B strep negative.  April 30, 2006,  urine culture negative.  April 30, 2006, quantitative BAL negative.  May 02, 2006, body culture negative.  April 30, 2006, blood cultures x2  negative.   RADIOLOGY:  April 30, 2006, renal ultrasound showing mild right  hydronephrosis.  May 09, 2006, abdominal ultrasound negative for  cholecystitis.  May 11, 2006, continued improved aeration with decreased  diffuse air space disease.  May 08, 2006, follow-up renal ultrasound,  mild enlargement of right kidney with increased echogenicity of upper pole  consistent with pyelo.  No evidence of renal abscess or hydronephrosis.   BRIEF HISTORY:  This is a pleasant 25 year old African American female who  was referred to the pulmonary intensive care service on April 30, 2006, for  severe sepsis, profound septic shock, respiratory failure.  She was  originally admitted for onset fever, chills, urinary tract symptoms.  She  quickly deteriorated from a clinical standpoint.  We were therefore asked  to  see her.  On initial evaluation, she was oliguric, was hypotensive and  developing worsening respiratory failure, she required endotracheal  intubation and central line placement on April 30, 2006, for aggressive  goal directed therapy.   HOSPITAL COURSE BY DISCHARGE DIAGNOSIS:  DISCHARGE DIAGNOSIS #1 - SEVERE  SEPSIS, SEPTIC SHOCK WITH ACUTE RESPIRATORY FAILURE/ADULT RESPIRATORY  DISTRESS SYNDROME RESULTING IN VENTILATOR DEPENDENT RESPIRATORY FAILURE,  SECONDARY TO PYELONEPHRITIS WITH POSSIBLE __________ 2007:  [name] was  seen and transferred to the pulmonary critical care service on April 30, 2006, for refractory sepsis.  She received aggressive IV fluid  resuscitation.  She was transferred from [facility] to [facility].  Prior to transfer, she was intubated with a #7  endotracheal tube, a right internal jugular line was placed, she continued  to require aggressive IV fluid resuscitation as well as __________ drips.  Her vital signs remained initially quite unstable. She underwent an  echocardiogram on April 30, 2006, demonstrating probable cardiomyopathy of  pregnancy.  In as far as pyelonephritis, an early urology consultation was  requested.  She had a urostomy tube placed under interventional  radiology  for drainage of pyelo.  She later required pulmonary artery catheterization  to help manage both her severe sepsis, as well as her cardiomyopathy.  She  continued to slowly improve over the course of her hospitalization.  Eventually blood pressure medication and aggressive hydration was  discontinued. She completed a total antibiotic course of 15 days which  consisted initially of Imipenem, and then eventually was converted to Cipro.  Her nephrostomy tube was removed on May 10, 2006, without complications  or further sequelae.  She was liberated from mechanical ventilation on  May 07, 2006.  At that time her chest x-ray has continued to  improve.  She has been successfully weaned off of supplemental oxygen.  There were no  further signs or symptoms of pyelonephritis and she has completely resolved  from a pulmonary standpoint.   DISCHARGE DIAGNOSIS #2 - RIGHT FEMORAL DEEP VENOUS THROMBOSIS IDENTIFIED BY  ULTRASOUND:  This was treated by low molecular weight heparin, and bridged  to Coumadin therapy.  She will be discharged to home on Coumadin therapy  with follow-up at the [facility].   DISCHARGE DIAGNOSIS #3 - CARDIOMYOPATHY OF PREGNANCY:  As previously noted,  required aggressive hemodynamic support and pulmonary artery catheter  placement during critical illness.  This has since resolved and, in fact,  required some degree of diuresis during her convalescent stage.  No further  signs or symptoms of decompensated cardiac function.  She will require  follow-up TTE in 2-3 months following discharge.   DISCHARGE INSTRUCTIONS:  1. Diet as tolerated.  2. Activity as tolerated.   MEDICATIONS:  1. Coumadin 5 mg tablets, she will take two tablets daily for the next two      days following discharge, then will be managed primarily by the      C[facility]madin Clinic at [facility].  2. Protonix one tablet daily.   FOLLOW UP:  1. Leslye Peer, MD, June 07, 2006.  2. Hal Morales, M.D., June 08, 2006.  3. Coumadin Clinic May 20, 2006, at 1:15 p.m.   PHYSICAL EXAMINATION/DISPOSITION UPON TIME OF DISCHARGE:  VITAL SIGNS:  Afebrile.  Vital signs are stable.  Heart rate in the 80s, blood pressure  90/54, respirations 16-18, saturations 100% on room air.  HEENT:  No JVD or adenopathy.  PULMONARY:  Clear to auscultation.  CARDIOVASCULAR:  Regular rate and rhythm, no murmurs, rubs, or gallops.  ABDOMEN:  Soft and nontender.  GU:  Voiding without dysuria.  EXTREMITIES:  No edema, 2+ pulses, warm to palpation.   IMPRESSION:  Currently ready for discharge and home Coumadin therapy with follow-up as  previously arranged.  Over 30 minutes time was dedicated to  patient instruction and arrangement of outpatient care.      Zenia Resides, NP    ______________________________  Leslye Peer, MD    PB/MEDQ  D:  05/18/2006  T:  05/18/2006  Job:  [id number]   cc:   Hal Morales, M.D.  Coumadin Clinic

## 2011-02-13 NOTE — Consult Note (Signed)
[name], [name]                 ACCOUNT NO.:  [id number]   MEDICAL RECORD NO.:  [id number]          PATIENT TYPE:  INP   LOCATION:  2110                         FACILITY:  [facility]   PHYSICIAN:  Mindi Slicker. Lowell Guitar, M.D.  DATE OF BIRTH:  [birth year]   DATE OF CONSULTATION:  DATE OF DISCHARGE:                                   CONSULTATION   REFERRING PHYSICIAN:   PRESUMPTIVE DIAGNOSIS:  Pyelonephritis.   __________   ASSESSMENT:      Mindi Slicker. Lowell Guitar, M.D.  Electronically Signed     ACP/MEDQ  D:  04/30/2006  T:  05/01/2006  Job:  [id number]

## 2011-02-13 NOTE — Assessment & Plan Note (Signed)
[name]                             PULMONARY OFFICE NOTE   [name], [name]                        MRN:          [id number]  DATE:09/29/2006                            DOB:          [birth year]    HISTORY OF PRESENT ILLNESS:  The patient is 25 year old African American  female patient of Dr. Kavin Leech who has a history of acute respiratory  distress syndrome, septic shock and pyelonephritis in the setting of a  21-week intrauterine pregnancy in August 2007.  The patient also had  experienced a right lower extremity DVT, and is on chronic Coumadin  therapy for a total of 6 months.  The patient presents today for an  acute office visit.  The patient reports she has been doing extremely  well up until the last 5 days when she developed a productive cough with  thick yellow mucus, nasal congestion and stuffy nose.  The patient  denies any chest pain, orthopnea, PND or leg swelling.   PAST MEDICAL HISTORY:  Reviewed.   CURRENT MEDICATIONS:  Reviewed.   PHYSICAL EXAMINATION:  GENERAL:  The patient is a 25 year old female in  no acute distress.  VITAL SIGNS:  She afebrile with stable vital signs, saturations at 98%  on room air.  HEENT:  Pale nasal mucosa.  TMs normal.  NECK:  Supple without adenopathy.  LUNGS:  Sounds clear without any wheezing or crackles.  CARDIAC:  Regular rate.  ABDOMEN:  Soft and benign.  EXTREMITIES:  Warm without any edema.   IMPRESSION/PLAN:  Acute upper respiratory infection.  Patient which are  acute upper respiratory infection.  The patient is begun on Z-Pak.  Mucinex DM,  Nasal hygiene saline nasal spray.  Patient is to return  back as scheduled, back with Dr. Delton Coombes or sooner if needed.      Rubye Oaks, NP  Electronically Signed      Leslye Peer, MD  Electronically Signed   TP/MedQ  DD: 09/29/2006  DT: 09/29/2006  Job #: [id number]

## 2011-02-13 NOTE — Assessment & Plan Note (Signed)
[facility]                             PULMONARY OFFICE NOTE   AERON, LHEUREUX                        MRN:          [id number]  DATE:11/22/2006                            DOB:          11-15-1985    This is a regularly scheduled follow up visit for [name] who is a 25-  year-old women with a history of critical care illness in August of 2007  with septic shock complicated by acute respiratory distress syndrome.  During that hospitalization she had a right lower extremity deep vein  thrombosis and we have been monitoring her on Coumadin therapy in our  Coumadin clinic since that hospitalization.  She presents back today  telling me that she feels well.  She has no complaints.  She had some  residual upper airway irritation and cough that has fully resolved.  She  denies any leg pain or dyspnea.  She has been on Coumadin for 6 months  and this visit is to discuss cessation.   CURRENT MEDICATIONS:  Coumadin 7.5 mg daily.   PHYSICAL EXAMINATION:  GENERAL:  This is a thin, well appearing young  women in no distress on room air.  Her weight is 99 pounds, temperature 98.5, blood pressure 96/64, heart  rate 88, SPO2 96% on room air.  HEENT:  Oropharynx is clear.  NECK:  Supple without lymphadenopathy.  LUNGS:  Clear to auscultation bilaterally.  HEART:  Has a regular rate and rhythm without murmur.  ABDOMEN:  Soft, nontender, nondistended, positive bowel sounds and no  hepatosplenomegaly.  EXTREMITIES:  Have no cyanosis, clubbing or edema.  NEUROLOGICALLY:  She has a grossly non focal exam.   IMPRESSION:  1. Status post a critical care illness with septic shock and acute      respiratory distress syndrome, now completely recovered.  2. Right lower extremity deep vein thrombosis in the setting of the      above.  She has been on Coumadin therapy for 6 months and we will      plan to stop anticoagulation at this time.  Some of her  hypercoagulable work up has already been performed by [name]      per the patient, I do not have these lab results available to me at      this time.  She will need protein S and protein C testing to      complete the evaluation for her hypercoagulable state now that the      Coumadin has been discontinued.  I will leave this to [name].      [name] can follow up with me on a as needed basis, otherwise I      am releasing her without any limitations.    Leslye Peer, MD  Electronically Signed   RSB/MedQ  DD: 11/22/2006  DT: 11/22/2006  Job #: [id number]   cc:   Hal Morales, M.D.

## 2011-06-29 LAB — RH IMMUNE GLOBULIN WORKUP (NOT WOMEN'S HOSP)
ABO/RH(D): A NEG
Antibody Screen: NEGATIVE

## 2011-06-30 LAB — DIFFERENTIAL
Basophils Absolute: 0
Basophils Relative: 0
Eosinophils Absolute: 0
Eosinophils Relative: 0
Lymphocytes Relative: 4 — ABNORMAL LOW
Lymphs Abs: 0.7
Monocytes Absolute: 0.3
Monocytes Relative: 2 — ABNORMAL LOW
Neutro Abs: 14.8 — ABNORMAL HIGH
Neutrophils Relative %: 94 — ABNORMAL HIGH

## 2011-06-30 LAB — URINE MICROSCOPIC-ADD ON

## 2011-06-30 LAB — COMPREHENSIVE METABOLIC PANEL
CO2: 23
Calcium: 8.8
Creatinine, Ser: 0.31 — ABNORMAL LOW
GFR calc non Af Amer: >60
Glucose, Bld: 144 — ABNORMAL HIGH

## 2011-06-30 LAB — CBC
HCT: 33.4 — ABNORMAL LOW
Hemoglobin: 11.4 — ABNORMAL LOW
MCHC: 34.2
MCV: 93
Platelets: 214
RBC: 3.59 — ABNORMAL LOW
RDW: 13.6
WBC: 15.8 — ABNORMAL HIGH

## 2011-06-30 LAB — GC/CHLAMYDIA PROBE AMP, GENITAL: GC Probe Amp, Genital: NEGATIVE

## 2011-06-30 LAB — URINALYSIS, ROUTINE W REFLEX MICROSCOPIC
Bilirubin Urine: NEGATIVE
Bilirubin Urine: NEGATIVE
Glucose, UA: NEGATIVE
Glucose, UA: NEGATIVE
Hgb urine dipstick: NEGATIVE
Ketones, ur: 15 — AB
Leukocytes, UA: NEGATIVE
Nitrite: NEGATIVE
Protein, ur: 30 — AB
Protein, ur: 30 — AB
Specific Gravity, Urine: 1.015
Urobilinogen, UA: 0.2
Urobilinogen, UA: 0.2
pH: 6
pH: 6.5

## 2011-06-30 LAB — URINE CULTURE
Colony Count: NO GROWTH
Culture: NO GROWTH

## 2011-06-30 LAB — WET PREP, GENITAL: Trich, Wet Prep: NONE SEEN

## 2011-06-30 LAB — CULTURE, BETA STREP (GROUP B ONLY)

## 2011-06-30 LAB — AMYLASE: Amylase: 70

## 2011-06-30 LAB — LIPASE, BLOOD: Lipase: 16

## 2011-07-13 LAB — POCT URINALYSIS DIP (DEVICE)
Glucose, UA: NEGATIVE
Specific Gravity, Urine: 1.025
Urobilinogen, UA: 1

## 2012-02-11 ENCOUNTER — Other Ambulatory Visit: Payer: Self-pay | Admitting: Obstetrics and Gynecology

## 2012-02-11 NOTE — Telephone Encounter (Signed)
Tc from pt. Pt req rf for Loestrin 24. Pt has been off bcp's x 2wks. Denies unprotected ic. Pt to have had fu with [facility] in February 2013 for eval of dysmenorrhea and Mefanemic acid for cramping. Pt states,"had a pretty bad episode of cramping yesterday". Appt sched 02/12/12@11 :15 with [facility] for eval. Pt voices understanding.

## 2012-02-11 NOTE — Telephone Encounter (Signed)
Lm on vm to cb per rx req.

## 2012-02-11 NOTE — Telephone Encounter (Signed)
chandra/epic 

## 2012-02-12 ENCOUNTER — Encounter: Payer: Self-pay | Admitting: Obstetrics and Gynecology

## 2012-02-12 ENCOUNTER — Ambulatory Visit (INDEPENDENT_AMBULATORY_CARE_PROVIDER_SITE_OTHER): Payer: Private Health Insurance - Indemnity | Admitting: Obstetrics and Gynecology

## 2012-02-12 VITALS — BP 92/58 | Ht 64.0 in | Wt 102.0 lb

## 2012-02-12 DIAGNOSIS — N946 Dysmenorrhea, unspecified: Secondary | ICD-10-CM

## 2012-02-12 NOTE — Progress Notes (Signed)
Last bcps in end of April Nausea with menses Pretty good relief with bcps Nodularity on right  Subjective: The patient presents today with a history of recurrent dysmenorrhea. She was seen shortly after her her delivery in 2010 and started on sequential oral contraceptive pills. In June of 2012 the patient presented complaining of increasingly severe dysmenorrhea associated with nausea vomiting and diarrhea. This had been the pattern of her cramping prior to her pregnancies she was started on an extended cycle oral contraceptive pills with good relief of her dysmenorrhea until she missed her pills because she ran out.  She has subsequently had recurrence of the severe menstrual symptoms as described above. She denies any dyspareunia. She does not have significant abdominal pain outside of her menses.   Objective: BP 92/58  Wt 102 lb (46.267 kg)  LMP 01/19/2012 Abdomen: Soft without masses or organomegaly. There is mild tenderness to deep palpation in the right and left lower quadrant no rebound Pelvic:  External genitalia Bartholin's urethra and Skene's are within normal limits  Vagina is rugous with some palpable nodularity along the right vaginal sidewall this is mildly tender  Cervix no gross lesions no cervical motion tenderness  Uterus is normal size shape and consistency and mobile. There is mild tenderness to motion of the uterus  Adnexa: No palpable masses but mild tenderness bilaterally  Rectovaginal: No palpable nodularity or tenderness.  Assessment: Recurrent dysmenorrhea after a break in the use of extended cycle oral contraceptive pills after having gotten good relief from this mass History and physical examination that could be consistent with endometriosis  Recommendation: Gonorrhea and Chlamydia cultures are done with the patient consent Ultrasound is ordered and further evaluation will be reviewed at that time

## 2012-02-13 DIAGNOSIS — Z8619 Personal history of other infectious and parasitic diseases: Secondary | ICD-10-CM | POA: Insufficient documentation

## 2012-02-13 DIAGNOSIS — N946 Dysmenorrhea, unspecified: Secondary | ICD-10-CM | POA: Insufficient documentation

## 2012-02-13 LAB — GC/CHLAMYDIA PROBE AMP, GENITAL: GC Probe Amp, Genital: NEGATIVE

## 2012-03-01 ENCOUNTER — Other Ambulatory Visit: Payer: Self-pay | Admitting: Obstetrics and Gynecology

## 2012-03-01 ENCOUNTER — Encounter: Payer: Self-pay | Admitting: Obstetrics and Gynecology

## 2012-03-01 ENCOUNTER — Ambulatory Visit (INDEPENDENT_AMBULATORY_CARE_PROVIDER_SITE_OTHER): Payer: Private Health Insurance - Indemnity | Admitting: Obstetrics and Gynecology

## 2012-03-01 ENCOUNTER — Ambulatory Visit (INDEPENDENT_AMBULATORY_CARE_PROVIDER_SITE_OTHER): Payer: Private Health Insurance - Indemnity

## 2012-03-01 VITALS — BP 102/58 | Ht 65.0 in | Wt 104.0 lb

## 2012-03-01 DIAGNOSIS — N946 Dysmenorrhea, unspecified: Secondary | ICD-10-CM

## 2012-03-01 DIAGNOSIS — N949 Unspecified condition associated with female genital organs and menstrual cycle: Secondary | ICD-10-CM

## 2012-03-01 DIAGNOSIS — R102 Pelvic and perineal pain: Secondary | ICD-10-CM

## 2012-03-01 MED ORDER — NORETHIN ACE-ETH ESTRAD-FE 1-20 MG-MCG(24) PO TABS: ORAL_TABLET | ORAL | Status: DC

## 2012-03-01 NOTE — Progress Notes (Signed)
FOLLOWUP The patient presents for a followup ultrasound to rule out pelvic masses that may explain significant dysmenorrhea. Ultrasound:  The uterus tubes and ovaries are normal with no adnexal masses  She also presents for followup of her extended cycle oral contraceptive pills. She however has not started her pills because she did not find them at the pharmacy and therefore did not pick them up nor did she call to get another prescription.  Impression: Severe dysmenorrhea No ultrasound evidence of endometriosis  Recommendation: The patient should begin her Loestrin 24 oral contraceptive pills today and take them 12 consecutive weeks of active pills then 4 days in active pills before starting a #12 weeks cycle. Followup in 3 months

## 2012-03-01 NOTE — Patient Instructions (Signed)
Start BCP prescription ASAP

## 2012-04-13 ENCOUNTER — Observation Stay (HOSPITAL_COMMUNITY)
Admission: EM | Admit: 2012-04-13 | Discharge: 2012-04-14 | Disposition: A | Discharge status: Home / Self Care | Payer: Private Health Insurance - Indemnity | Attending: Emergency Medicine | Admitting: Emergency Medicine

## 2012-04-13 ENCOUNTER — Observation Stay (HOSPITAL_COMMUNITY): Payer: Private Health Insurance - Indemnity

## 2012-04-13 ENCOUNTER — Encounter (HOSPITAL_COMMUNITY): Payer: Self-pay | Admitting: *Deleted

## 2012-04-13 DIAGNOSIS — R112 Nausea with vomiting, unspecified: Secondary | ICD-10-CM | POA: Insufficient documentation

## 2012-04-13 DIAGNOSIS — K047 Periapical abscess without sinus: Principal | ICD-10-CM | POA: Insufficient documentation

## 2012-04-13 LAB — CBC WITH DIFFERENTIAL/PLATELET
Basophils Relative: 0 % (ref 0–1)
Eosinophils Absolute: 0 10*3/uL (ref 0.0–0.7)
Eosinophils Relative: 0 % (ref 0–5)
HCT: 33.2 % — ABNORMAL LOW (ref 36.0–46.0)
Hemoglobin: 11.6 g/dL — ABNORMAL LOW (ref 12.0–15.0)
Lymphs Abs: 1.4 10*3/uL (ref 0.7–4.0)
MCH: 29.4 pg (ref 26.0–34.0)
MCHC: 34.9 g/dL (ref 30.0–36.0)
MCV: 84.1 fL (ref 78.0–100.0)
Monocytes Absolute: 0.9 10*3/uL (ref 0.1–1.0)
Monocytes Relative: 7 % (ref 3–12)
Neutrophils Relative %: 82 % — ABNORMAL HIGH (ref 43–77)
RBC: 3.95 MIL/uL (ref 3.87–5.11)

## 2012-04-13 LAB — COMPREHENSIVE METABOLIC PANEL
Albumin: 3.7 g/dL (ref 3.5–5.2)
Alkaline Phosphatase: 47 U/L (ref 39–117)
BUN: 4 mg/dL — ABNORMAL LOW (ref 6–23)
Calcium: 8.9 mg/dL (ref 8.4–10.5)
Creatinine, Ser: 0.42 mg/dL — ABNORMAL LOW (ref 0.50–1.10)
GFR calc Af Amer: >90 mL/min (ref >90)
Glucose, Bld: 102 mg/dL — ABNORMAL HIGH (ref 70–99)
Total Protein: 7.3 g/dL (ref 6.0–8.3)

## 2012-04-13 MED ORDER — ONDANSETRON HCL 4 MG/2ML IJ SOLN
4.0000 mg | Freq: Four times a day (QID) | INTRAMUSCULAR | Status: DC | PRN
  Administered 2012-04-13: 4 mg via INTRAVENOUS
  Filled 2012-04-13: qty 2

## 2012-04-13 MED ORDER — MORPHINE SULFATE 4 MG/ML IJ SOLN
4.0000 mg | Freq: Once | INTRAMUSCULAR | Status: AC
  Administered 2012-04-13: 4 mg via INTRAVENOUS
  Filled 2012-04-13: qty 1

## 2012-04-13 MED ORDER — IBUPROFEN 400 MG PO TABS
600.0000 mg | ORAL_TABLET | Freq: Four times a day (QID) | ORAL | Status: DC | PRN
  Administered 2012-04-13: 600 mg via ORAL
  Filled 2012-04-13: qty 1

## 2012-04-13 MED ORDER — ONDANSETRON HCL 4 MG/2ML IJ SOLN
4.0000 mg | Freq: Once | INTRAMUSCULAR | Status: AC
  Administered 2012-04-13: 4 mg via INTRAVENOUS
  Filled 2012-04-13: qty 2

## 2012-04-13 MED ORDER — MORPHINE SULFATE 4 MG/ML IJ SOLN
4.0000 mg | INTRAMUSCULAR | Status: DC | PRN
  Administered 2012-04-13 (×2): 4 mg via INTRAVENOUS
  Filled 2012-04-13 (×2): qty 1

## 2012-04-13 MED ORDER — CLINDAMYCIN PHOSPHATE 600 MG/50ML IV SOLN
600.0000 mg | Freq: Three times a day (TID) | INTRAVENOUS | Status: DC
  Administered 2012-04-13 – 2012-04-14 (×2): 600 mg via INTRAVENOUS
  Filled 2012-04-13 (×2): qty 50

## 2012-04-13 MED ORDER — IBUPROFEN 400 MG PO TABS
600.0000 mg | ORAL_TABLET | Freq: Once | ORAL | Status: AC
  Administered 2012-04-13: 600 mg via ORAL
  Filled 2012-04-13: qty 1

## 2012-04-13 MED ORDER — CLINDAMYCIN PHOSPHATE 900 MG/50ML IV SOLN
900.0000 mg | Freq: Once | INTRAVENOUS | Status: AC
  Administered 2012-04-13: 900 mg via INTRAVENOUS
  Filled 2012-04-13: qty 50

## 2012-04-13 MED ORDER — IOHEXOL 300 MG/ML  SOLN
100.0000 mL | Freq: Once | INTRAMUSCULAR | Status: AC | PRN
  Administered 2012-04-13: 100 mL via INTRAVENOUS

## 2012-04-13 MED ORDER — SODIUM CHLORIDE 0.9 % IV SOLN
Freq: Once | INTRAVENOUS | Status: AC
  Administered 2012-04-13: 10:00:00 via INTRAVENOUS

## 2012-04-13 NOTE — ED Provider Notes (Signed)
Patient here with left sided dental pain and facial swelling. CT showing left anterior facial abscess with surrounding cellulitis. Sitting in room in NAD but with moderate pain.  Ibuprofen 600mg  given.  2:46 PM Patient resting comfortably. Pain level decreased. Left sided facial swelling has gone down a little. Able to open mouth easier.   4:58 PM Facial pain and swelling continues to improve. Admits to random feeling on left side of chest described as squeezing. No sob. Patient in NAD.   Date: 04/13/2012  Rate: 102  Rhythm: sinus tachycardia  QRS Axis: normal  Intervals: normal  ST/T Wave abnormalities: nonspecific T wave changes  Conduction Disutrbances:none  Old EKG Reviewed:  no significant changes since 04/2006  6:22 PM Patient no longer with chest pains. Sleeping comfortably in bed. Continue to monitor. Patient signed out to Dr. Golda Acre at 8pm.   Trevor Mace, [place] 04/18/12 1105

## 2012-04-13 NOTE — ED Notes (Signed)
Patient reports she was seen by dentist on yesterday.  She was started on meds for infection.  She now has increased swelling to her face on the left side.  She states she has also had n/v.

## 2012-04-13 NOTE — ED Notes (Signed)
Pt reports (L) upper mouth pain x2 days, seen her dentist yesterday, the dentist informed pt she had an "infection in the nerve of her tooth." Pt reports some facial swelling yesterday but woke this am w/increase swelling, pain, and N/V. Dentist prescribed pt Clindamycin 300 mg QID and Hydrocodone/acetaminophen 5/500 mg PRN. Pt is concerned the medicine caused the increase swelling.

## 2012-04-13 NOTE — ED Notes (Signed)
MD at bedside. Thurston Hole NP

## 2012-04-13 NOTE — ED Notes (Signed)
Pt denies pain w/swallowing or breathing, neck, ear, or throat pain. Pt a/o x4, airway intact

## 2012-04-13 NOTE — ED Notes (Signed)
Patient transported to CT 

## 2012-04-13 NOTE — ED Provider Notes (Signed)
History     CSN: [id number]  Arrival date & time 04/13/12  6213   First MD Initiated Contact with Patient 04/13/12 0825      Chief Complaint  Patient presents with  . Dental Pain  . Nausea  . Emesis  . Facial Swelling    (Consider location/radiation/quality/duration/timing/severity/associated sxs/prior treatment) Patient is a 26 y.o. female presenting with tooth pain and vomiting. The history is provided by the patient and a parent. No language interpreter was used.  Dental PainThe primary symptoms include mouth pain. Primary symptoms do not include dental injury, headaches, fever, shortness of breath or cough. The symptoms began 2 days ago. The symptoms are worsening. The symptoms occur constantly.  Additional symptoms include: dental sensitivity to temperature, gum tenderness and facial swelling. Additional symptoms do not include: gum swelling, purulent gums, trismus, trouble swallowing, taste disturbance and swollen glands.   Emesis  This is a new problem. The current episode started 1 to 2 hours ago. The problem has been gradually worsening. The emesis has an appearance of stomach contents. There has been no fever. Pertinent negatives include no cough, no diarrhea, no fever, no headaches and no URI.   26 year old female who went to the dentist yesterday with dental abscess treated with clindamycin by mouth. States she woke this morning with left facial swelling with periorbital edema. Denies fever but has vomited x1. No difficulty swallowing or breathing.  Past Medical History  Diagnosis Date  . Asthma   . Low BMI 2007    During pregnancy  . H/O pyelonephritis   . H/O acute respiratory distress syndrome     With a history of septic shock  . Hoarseness of voice     intermittent  . DVT (deep venous thrombosis), right   . Bronchitis, asthmatic   . Hx: UTI (urinary tract infection)     x 1  . H/O varicella   . Hepatitis     vaccine x3  . Yeast infection   . H/O  dysmenorrhea 2005  . H/O septic shock 05/2006  . Bilateral leg numbness 05/24/2007  . FHx: heart disease   . FHx: thyroid disease   . FHx: cancer   . FHx: diabetes mellitus   . FHx: migraine headaches   . FHx: stroke   . FHx: kidney disease     Past Surgical History  Procedure Date  . Wisdom tooth extraction     x 4    Family History  Problem Relation Age of Onset  . Diabetes Maternal Grandmother   . Heart disease Maternal Grandmother     heart attack  . COPD Maternal Grandmother     bronchitis  . Hypertension Father   . Fibroids Mother     had hyst  . Hypertension Mother   . Fibroids Paternal Aunt   . Cancer Paternal Grandfather     brain tumor  . Hypertension Maternal Grandfather   . Stroke Maternal Grandfather   . Kidney disease Maternal Grandfather     dialysis    History  Substance Use Topics  . Smoking status: Never Smoker   . Smokeless tobacco: Never Used  . Alcohol Use: Yes    OB History    Grav Para Term Preterm Abortions TAB SAB Ect Mult Living   2 1 1  1  0 1 0 0 1      Review of Systems  Constitutional: Negative.  Negative for fever.  HENT: Positive for facial swelling and dental problem. Negative for trouble  swallowing, neck pain, neck stiffness, sinus pressure and ear discharge.   Eyes: Negative.   Respiratory: Negative for cough and shortness of breath.   Cardiovascular: Negative.   Gastrointestinal: Positive for vomiting. Negative for diarrhea.  Neurological: Negative.  Negative for headaches.  Psychiatric/Behavioral: Negative.   All other systems reviewed and are negative.    Allergies  Amoxicillin  Home Medications   Current Outpatient Rx  Name Route Sig Dispense Refill  . CLINDAMYCIN HCL 300 MG PO CAPS Oral Take 300 mg by mouth 4 (four) times daily. 7 day course, Started 04-12-12;    . HYDROCODONE-ACETAMINOPHEN 5-500 MG PO TABS Oral Take 1 tablet by mouth every 4 (four) hours as needed. For pain    . MONTELUKAST SODIUM 10 MG PO  TABS Oral Take 10 mg by mouth at bedtime.    Azzie Roup ACE-ETH ESTRAD-FE 1-20 MG-MCG(24) PO TABS Oral Take 1 tablet by mouth daily. One active pill daily for 12 consecutive weeks, then 4 days without pills.  Start active pills again for 12 consecutive weeks.  Will need 4 packages for 3 months      BP 110/71  Pulse 105  Temp 98.9 F (37.2 C) (Oral)  Resp 16  Ht 5\' 4"  (1.626 m)  Wt 104 lb (47.174 kg)  BMI 17.85 kg/m2  SpO2 100%  LMP 02/12/2012  Physical Exam  Nursing note and vitals reviewed. Constitutional: She is oriented to person, place, and time. She appears well-developed and well-nourished.  HENT:  Head: Normocephalic and atraumatic.  Right Ear: Tympanic membrane normal.  Left Ear: Tympanic membrane normal.  Mouth/Throat: Mucous membranes are normal. Dental abscesses present. No oropharyngeal exudate, posterior oropharyngeal edema, posterior oropharyngeal erythema or tonsillar abscesses.    Eyes: Conjunctivae and EOM are normal. Pupils are equal, round, and reactive to light.  Neck: Normal range of motion. Neck supple.  Cardiovascular: Normal rate.   Pulmonary/Chest: Effort normal.  Abdominal: Soft.  Musculoskeletal: Normal range of motion.  Neurological: She is alert and oriented to person, place, and time. She has normal reflexes.  Skin: Skin is warm and dry.  Psychiatric: She has a normal mood and affect.    ED Course  Procedures (including critical care time)   Labs Reviewed  CBC  BASIC METABOLIC PANEL   No results found.   No diagnosis found.    MDM  Patient will move to CDU on the cellulitis protocol. Clindamycin 900 mg started initially. Patient's pain controlled with morphine 4 mg IV. Zofran given for nausea. Her pain started 2 days again and was seen by a dentist yesterday and started on by mouth clindamycin for a dental abscess in the front upper teeth. No visible abscess but left facial swelling. Orbital edema the patient needs to be watched in the  CDU overnight.  Maxillofacial CT pending. Report given to [facility] in the CDU unit.        Remi Haggard, NP 04/13/12 1109

## 2012-04-13 NOTE — Progress Notes (Signed)
Observation review is complete. 

## 2012-04-14 MED ORDER — IBUPROFEN 800 MG PO TABS: 800.0000 mg | ORAL_TABLET | Freq: Three times a day (TID) | ORAL | Status: AC

## 2012-04-14 MED ORDER — HYDROCODONE-ACETAMINOPHEN 5-325 MG PO TABS: 2.0000 | ORAL_TABLET | ORAL | Status: AC | PRN

## 2012-04-14 MED ORDER — CLINDAMYCIN HCL 150 MG PO CAPS: 150.0000 mg | ORAL_CAPSULE | Freq: Four times a day (QID) | ORAL | Status: AC

## 2012-04-14 NOTE — ED Provider Notes (Signed)
PT care assumed at 12am, PT with facial swelling/ denatl abscess on cellulitis protocol. CT scan reviewed. PT evaluated. No F/C, vomited once. Has DDs and PCP and her dentist plans a root canal on this tooth. At 6am on recheck her swelling in much improved and not apparent by exam, PT feels some mild swelling but her pain is gone, not requiring pain meds since 9pm last night. She is requesting d/c home. PLan Abx, pain medications and f/u with her dentist for planned root canal.  Sunnie Nielsen, MD 04/14/12 [phone]

## 2012-04-15 NOTE — ED Provider Notes (Signed)
Medical screening examination/treatment/procedure(s) were performed by non-physician practitioner and as supervising physician I was immediately available for consultation/collaboration.   Dione Booze, MD 04/15/12 [phone]

## 2012-04-19 NOTE — ED Provider Notes (Signed)
Medical screening examination/treatment/procedure(s) were performed by non-physician practitioner and as supervising physician I was immediately available for consultation/collaboration.   Dione Booze, MD 04/19/12 [phone]

## 2012-05-20 ENCOUNTER — Other Ambulatory Visit (HOSPITAL_BASED_OUTPATIENT_CLINIC_OR_DEPARTMENT_OTHER): Payer: Self-pay | Admitting: Nurse Practitioner

## 2012-05-20 ENCOUNTER — Ambulatory Visit (HOSPITAL_BASED_OUTPATIENT_CLINIC_OR_DEPARTMENT_OTHER)
Admission: RE | Admit: 2012-05-20 | Discharge: 2012-05-20 | Disposition: A | Discharge status: Home / Self Care | Payer: Private Health Insurance - Indemnity | Source: Ambulatory Visit | Attending: Nurse Practitioner | Admitting: Nurse Practitioner

## 2012-05-20 DIAGNOSIS — R509 Fever, unspecified: Secondary | ICD-10-CM | POA: Insufficient documentation

## 2012-11-24 ENCOUNTER — Other Ambulatory Visit: Payer: Self-pay | Admitting: Obstetrics and Gynecology

## 2012-11-24 NOTE — Telephone Encounter (Signed)
Vh to address. Pt was due for follow up in 05/2012.

## 2013-02-24 ENCOUNTER — Encounter (HOSPITAL_COMMUNITY): Payer: Self-pay | Admitting: Family

## 2013-02-24 ENCOUNTER — Inpatient Hospital Stay (HOSPITAL_COMMUNITY)
Admission: AD | Admit: 2013-02-24 | Discharge: 2013-02-24 | Disposition: A | Discharge status: Home / Self Care | Payer: Managed Care, Other (non HMO) | Source: Ambulatory Visit | Attending: Obstetrics and Gynecology | Admitting: Obstetrics and Gynecology

## 2013-02-24 DIAGNOSIS — R109 Unspecified abdominal pain: Secondary | ICD-10-CM | POA: Insufficient documentation

## 2013-02-24 DIAGNOSIS — O4690 Antepartum hemorrhage, unspecified, unspecified trimester: Secondary | ICD-10-CM | POA: Diagnosis present

## 2013-02-24 DIAGNOSIS — O209 Hemorrhage in early pregnancy, unspecified: Secondary | ICD-10-CM | POA: Insufficient documentation

## 2013-02-24 DIAGNOSIS — O360990 Maternal care for other rhesus isoimmunization, unspecified trimester, not applicable or unspecified: Secondary | ICD-10-CM | POA: Insufficient documentation

## 2013-02-24 LAB — CBC
HCT: 35.4 % — ABNORMAL LOW (ref 36.0–46.0)
Hemoglobin: 12.1 g/dL (ref 12.0–15.0)
MCHC: 34.2 g/dL (ref 30.0–36.0)
RDW: 12.7 % (ref 11.5–15.5)
WBC: 8.9 10*3/uL (ref 4.0–10.5)

## 2013-02-24 LAB — URINALYSIS, ROUTINE W REFLEX MICROSCOPIC
Glucose, UA: NEGATIVE mg/dL
Ketones, ur: NEGATIVE mg/dL
Leukocytes, UA: NEGATIVE
Nitrite: NEGATIVE
pH: 7.5 (ref 5.0–8.0)

## 2013-02-24 LAB — OB RESULTS CONSOLE GC/CHLAMYDIA
CHLAMYDIA, DNA PROBE: NEGATIVE
Gonorrhea: NEGATIVE

## 2013-02-24 LAB — URINE MICROSCOPIC-ADD ON

## 2013-02-24 LAB — POCT PREGNANCY, URINE: Preg Test, Ur: POSITIVE — AB

## 2013-02-24 LAB — HCG, QUANTITATIVE, PREGNANCY: hCG, Beta Chain, Quant, S: 15 m[IU]/mL — ABNORMAL HIGH (ref <5)

## 2013-02-24 MED ORDER — RHO D IMMUNE GLOBULIN 1500 UNIT/2ML IJ SOLN
300.0000 ug | Freq: Once | INTRAMUSCULAR | Status: AC
  Administered 2013-02-24: 300 ug via INTRAMUSCULAR
  Filled 2013-02-24: qty 2

## 2013-02-24 MED ORDER — IBUPROFEN 600 MG PO TABS
600.0000 mg | ORAL_TABLET | Freq: Four times a day (QID) | ORAL | Status: DC | PRN
  Administered 2013-02-24: 600 mg via ORAL
  Filled 2013-02-24: qty 1

## 2013-02-24 NOTE — MAU Provider Note (Signed)
History   27 yo G3P1011 at 4 2/7 weeks presented after calling the office reporting +UPT and onset of moderate bleeding at 3am today, with mild/moderate cramping.  LMP 01/25/13.  Knows A negative blood type.  Patient Active Problem List   Diagnosis Date Noted  . Dysmenorrhea 02/13/2012  . ALLERGIC RHINITIS 09/03/2009  . ASTHMA 09/03/2009  . GASTROENTERITIS 09/03/2009  . HEADACHE 09/03/2009  . DEEP VENOUS THROMBOPHLEBITIS, LEG, RIGHT 07/27/2007  . ADULT RESPIRATORY DISTRESS SYNDROME 07/27/2007   Hx sepis in 2007 at 21 weeks, with fetal demise, renal failure, ARDS, and right DVT.   Chief Complaint  Patient presents with  . Vaginal Bleeding     OB History   Grav Para Term Preterm Abortions TAB SAB Ect Mult Living   3 1 1  1  0 1 0 0 1      Past Medical History  Diagnosis Date  . Asthma   . Low BMI 2007    During pregnancy  . H/O pyelonephritis   . H/O acute respiratory distress syndrome     With a history of septic shock  . Hoarseness of voice     intermittent  . DVT (deep venous thrombosis), right   . Bronchitis, asthmatic   . Hx: UTI (urinary tract infection)     x 1  . H/O varicella   . Hepatitis     vaccine x3  . Yeast infection   . H/O dysmenorrhea 2005  . H/O septic shock 05/2006  . Bilateral leg numbness 05/24/2007  . FHx: heart disease   . FHx: thyroid disease   . FHx: cancer   . FHx: diabetes mellitus   . FHx: migraine headaches   . FHx: stroke   . FHx: kidney disease     Past Surgical History  Procedure Laterality Date  . Wisdom tooth extraction      x 4    Family History  Problem Relation Age of Onset  . Diabetes Maternal Grandmother   . Heart disease Maternal Grandmother     heart attack  . COPD Maternal Grandmother     bronchitis  . Hypertension Father   . Fibroids Mother     had hyst  . Hypertension Mother   . Fibroids Paternal Aunt   . Cancer Paternal Grandfather     brain tumor  . Hypertension Maternal Grandfather   . Stroke  Maternal Grandfather   . Kidney disease Maternal Grandfather     dialysis    History  Substance Use Topics  . Smoking status: Never Smoker   . Smokeless tobacco: Never Used  . Alcohol Use: Yes    Allergies:  Allergies  Allergen Reactions  . Amoxicillin Hives    Prescriptions prior to admission  Medication Sig Dispense Refill  . acetaminophen (TYLENOL) 500 MG tablet Take 1,000 mg by mouth daily as needed for pain.      . diphenhydrAMINE (BENADRYL) 12.5 MG/5ML liquid Take 12.5 mg by mouth at bedtime as needed for allergies.      Marland Kitchen FEXOFENADINE HCL PO Take 180 mg by mouth daily. mucinex allergy      . flintstones complete (FLINTSTONES) 60 MG chewable tablet Chew 2 tablets by mouth daily.      Marland Kitchen levocetirizine (XYZAL) 5 MG tablet Take 5 mg by mouth every evening.      . Simethicone (GAS-X PO) Take 2 tablets by mouth daily as needed (gas).      . montelukast (SINGULAIR) 10 MG tablet Take 10 mg  by mouth at bedtime.         Physical Exam   Blood pressure 110/70, pulse 104, temperature 98.5 F (36.9 C), temperature source Oral, resp. rate 18, height 5\' 4"  (1.626 m), weight 107 lb (48.535 kg), last menstrual period 01/25/2013.  Chest clear Heart RRR without murmur Abd soft, NT Pelvic--moderate blood in vault, no clots at present.  When vault cleared of blood, minimal active bleeding noted.  Cervix closed, NT Ext WNL    ED Course  1st trimester bleeding Rh negative  Plan: GC, chlamydia CBC, Rhophylac w/u and administration, Rolly Pancake, Lisseth Brazeau CNM, [place] 02/24/2013 12:30 PM  Addendum:  Small amount BRB at present.  Received Ibuprophen 600 mg po for cramping with benefit. Received Rhophylac.  Results for orders placed during the hospital encounter of 02/24/13 (from the past 24 hour(s))  URINALYSIS, ROUTINE W REFLEX MICROSCOPIC     Status: Abnormal   Collection Time    02/24/13 11:20 AM      Result Value Range   Color, Urine YELLOW  YELLOW   APPearance HAZY (*)  CLEAR   Specific Gravity, Urine 1.015  1.005 - 1.030   pH 7.5  5.0 - 8.0   Glucose, UA NEGATIVE  NEGATIVE mg/dL   Hgb urine dipstick LARGE (*) NEGATIVE   Bilirubin Urine NEGATIVE  NEGATIVE   Ketones, ur NEGATIVE  NEGATIVE mg/dL   Protein, ur NEGATIVE  NEGATIVE mg/dL   Urobilinogen, UA 0.2  0.0 - 1.0 mg/dL   Nitrite NEGATIVE  NEGATIVE   Leukocytes, UA NEGATIVE  NEGATIVE  URINE MICROSCOPIC-ADD ON     Status: Abnormal   Collection Time    02/24/13 11:20 AM      Result Value Range   Squamous Epithelial / LPF RARE  RARE   WBC, UA 0-2  <3 WBC/hpf   RBC / HPF TOO NUMEROUS TO COUNT  <3 RBC/hpf   Bacteria, UA FEW (*) RARE   Urine-Other MUCOUS PRESENT    CBC     Status: Abnormal   Collection Time    02/24/13 11:30 AM      Result Value Range   WBC 8.9  4.0 - 10.5 K/uL   RBC 4.21  3.87 - 5.11 MIL/uL   Hemoglobin 12.1  12.0 - 15.0 g/dL   HCT 45.4 (*) 09.8 - 11.9 %   MCV 84.1  78.0 - 100.0 fL   MCH 28.7  26.0 - 34.0 pg   MCHC 34.2  30.0 - 36.0 g/dL   RDW 14.7  82.9 - 56.2 %   Platelets 214  150 - 400 K/uL  HCG, QUANTITATIVE, PREGNANCY     Status: Abnormal   Collection Time    02/24/13 11:30 AM      Result Value Range   hCG, Beta Chain, Quant, S 15 (*) <5 mIU/mL  RH IG WORKUP (INCLUDES ABO/RH)     Status: None   Collection Time    02/24/13 11:30 AM      Result Value Range   Gestational Age(Wks) 6     ABO/RH(D) A NEG     Antibody Screen NEG     Unit Number [id number]     Blood Component Type RHIG     Unit division 00     Status of Unit ISSUED     Transfusion Status OK TO TRANSFUSE    POCT PREGNANCY, URINE     Status: Abnormal   Collection Time    02/24/13 11:35 AM  Result Value Range   Preg Test, Ur POSITIVE (*) NEGATIVE   Impression: Early pregnancy, possible SAB Rh negative, received Rhophylac  Plan: Reviewed findings with patient. D/C home with bleeding precautions. Repeat QHCG on Sunday as outpatient. May use OTC Ibuprophen 600 mg po q 6 hours for 24 hours,  then prn.  Nigel Bridgeman, CNM 02/24/13 1:10p

## 2013-02-24 NOTE — MAU Note (Addendum)
Patient presents to MAU with c/o vaginal bleeding and passing some clots since 0300 todayt; reports intermittent lower abdominal cramping since yesterday 0900. Reports +HPT yesterday with LMP 01/25/13.  Patient blood type A NEGATIVE.

## 2013-02-24 NOTE — MAU Note (Addendum)
2+ home tests yesterday. Started cramping and spotting last night.  Has continued to spot and now passing clots.  Is rh neg, called office, instructed to come here.

## 2013-02-25 LAB — RH IG WORKUP (INCLUDES ABO/RH)
ABO/RH(D): A NEG
Gestational Age(Wks): 6

## 2013-02-25 LAB — GC/CHLAMYDIA PROBE AMP: CT Probe RNA: NEGATIVE

## 2013-02-26 ENCOUNTER — Encounter: Payer: Self-pay | Admitting: Obstetrics and Gynecology

## 2013-02-26 DIAGNOSIS — O039 Complete or unspecified spontaneous abortion without complication: Secondary | ICD-10-CM | POA: Insufficient documentation

## 2013-05-11 LAB — OB RESULTS CONSOLE RPR: RPR: NONREACTIVE

## 2013-05-11 LAB — OB RESULTS CONSOLE ANTIBODY SCREEN: ANTIBODY SCREEN: NEGATIVE

## 2013-05-11 LAB — OB RESULTS CONSOLE HIV ANTIBODY (ROUTINE TESTING): HIV: NONREACTIVE

## 2013-05-11 LAB — OB RESULTS CONSOLE ABO/RH: ABO/RH(D): NEGATIVE

## 2013-05-11 LAB — OB RESULTS CONSOLE RUBELLA ANTIBODY, IGM: RUBELLA: IMMUNE

## 2013-05-11 LAB — OB RESULTS CONSOLE HEPATITIS B SURFACE ANTIGEN: HEP B S AG: NEGATIVE

## 2013-09-28 NOTE — L&D Delivery Note (Signed)
Delivery Note At 9:34 PM a viable and healthy female was delivered via Vaginal, Spontaneous Delivery (Presentation: Left Occiput Anterior).  APGAR: 9, 9; weight .pending   Placenta status: Intact, Spontaneous. Not sent  Cord: 3 vessels with the following complications: None.  Cord pH: none  Anesthesia: Epidural  Episiotomy: None Lacerations: 2nd degree;Perineal Suture Repair: 3.0 chromic Est. Blood Loss (mL): 250  Mom to postpartum.  Baby to Couplet care / Skin to Skin.  [name] A 12/23/2013, 10:50 PM

## 2013-11-21 LAB — OB RESULTS CONSOLE GBS: GBS: POSITIVE

## 2013-12-12 ENCOUNTER — Other Ambulatory Visit: Payer: Self-pay | Admitting: Obstetrics and Gynecology

## 2013-12-15 ENCOUNTER — Inpatient Hospital Stay (HOSPITAL_COMMUNITY)
Admission: AD | Admit: 2013-12-15 | Discharge: 2013-12-15 | Disposition: A | Discharge status: Home / Self Care | Payer: Managed Care, Other (non HMO) | Source: Ambulatory Visit | Attending: Obstetrics and Gynecology | Admitting: Obstetrics and Gynecology

## 2013-12-15 ENCOUNTER — Encounter (HOSPITAL_COMMUNITY): Payer: Self-pay

## 2013-12-15 DIAGNOSIS — M549 Dorsalgia, unspecified: Secondary | ICD-10-CM | POA: Insufficient documentation

## 2013-12-15 DIAGNOSIS — O479 False labor, unspecified: Secondary | ICD-10-CM | POA: Insufficient documentation

## 2013-12-15 LAB — URINALYSIS, ROUTINE W REFLEX MICROSCOPIC
Bilirubin Urine: NEGATIVE
GLUCOSE, UA: NEGATIVE mg/dL
HGB URINE DIPSTICK: NEGATIVE
KETONES UR: NEGATIVE mg/dL
Leukocytes, UA: NEGATIVE
Nitrite: NEGATIVE
PROTEIN: NEGATIVE mg/dL
Specific Gravity, Urine: 1.01 (ref 1.005–1.030)
Urobilinogen, UA: 0.2 mg/dL (ref 0.0–1.0)
pH: 8 (ref 5.0–8.0)

## 2013-12-15 NOTE — Discharge Instructions (Signed)
Third Trimester of Pregnancy °The third trimester is from week 29 through week 42, months 7 through 9. The third trimester is a time when the fetus is growing rapidly. At the end of the ninth month, the fetus is about 20 inches in length and weighs 6 10 pounds.  °BODY CHANGES °Your body goes through many changes during pregnancy. The changes vary from woman to woman.  °· Your weight will continue to increase. You can expect to gain 25 35 pounds (11 16 kg) by the end of the pregnancy. °· You may begin to get stretch marks on your hips, abdomen, and breasts. °· You may urinate more often because the fetus is moving lower into your pelvis and pressing on your bladder. °· You may develop or continue to have heartburn as a result of your pregnancy. °· You may develop constipation because certain hormones are causing the muscles that push waste through your intestines to slow down. °· You may develop hemorrhoids or swollen, bulging veins (varicose veins). °· You may have pelvic pain because of the weight gain and pregnancy hormones relaxing your joints between the bones in your pelvis. Back aches may result from over exertion of the muscles supporting your posture. °· Your breasts will continue to grow and be tender. A yellow discharge may leak from your breasts called colostrum. °· Your belly button may stick out. °· You may feel short of breath because of your expanding uterus. °· You may notice the fetus "dropping," or moving lower in your abdomen. °· You may have a bloody mucus discharge. This usually occurs a few days to a week before labor begins. °· Your cervix becomes thin and soft (effaced) near your due date. °WHAT TO EXPECT AT YOUR PRENATAL EXAMS  °You will have prenatal exams every 2 weeks until week 36. Then, you will have weekly prenatal exams. During a routine prenatal visit: °· You will be weighed to make sure you and the fetus are growing normally. °· Your blood pressure is taken. °· Your abdomen will be  measured to track your baby's growth. °· The fetal heartbeat will be listened to. °· Any test results from the previous visit will be discussed. °· You may have a cervical check near your due date to see if you have effaced. °At around 36 weeks, your caregiver will check your cervix. At the same time, your caregiver will also perform a test on the secretions of the vaginal tissue. This test is to determine if a type of bacteria, Group B streptococcus, is present. Your caregiver will explain this further. °Your caregiver may ask you: °· What your birth plan is. °· How you are feeling. °· If you are feeling the baby move. °· If you have had any abnormal symptoms, such as leaking fluid, bleeding, severe headaches, or abdominal cramping. °· If you have any questions. °Other tests or screenings that may be performed during your third trimester include: °· Blood tests that check for low iron levels (anemia). °· Fetal testing to check the health, activity level, and growth of the fetus. Testing is done if you have certain medical conditions or if there are problems during the pregnancy. °FALSE LABOR °You may feel small, irregular contractions that eventually go away. These are called Braxton Hicks contractions, or false labor. Contractions may last for hours, days, or even weeks before true labor sets in. If contractions come at regular intervals, intensify, or become painful, it is best to be seen by your caregiver.  °  SIGNS OF LABOR  °· Menstrual-like cramps. °· Contractions that are 5 minutes apart or less. °· Contractions that start on the top of the uterus and spread down to the lower abdomen and back. °· A sense of increased pelvic pressure or back pain. °· A watery or bloody mucus discharge that comes from the vagina. °If you have any of these signs before the 37th week of pregnancy, call your caregiver right away. You need to go to the hospital to get checked immediately. °HOME CARE INSTRUCTIONS  °· Avoid all  smoking, herbs, alcohol, and unprescribed drugs. These chemicals affect the formation and growth of the baby. °· Follow your caregiver's instructions regarding medicine use. There are medicines that are either safe or unsafe to take during pregnancy. °· Exercise only as directed by your caregiver. Experiencing uterine cramps is a good sign to stop exercising. °· Continue to eat regular, healthy meals. °· Wear a good support bra for breast tenderness. °· Do not use hot tubs, steam rooms, or saunas. °· Wear your seat belt at all times when driving. °· Avoid raw meat, uncooked cheese, cat litter boxes, and soil used by cats. These carry germs that can cause birth defects in the baby. °· Take your prenatal vitamins. °· Try taking a stool softener (if your caregiver approves) if you develop constipation. Eat more high-fiber foods, such as fresh vegetables or fruit and whole grains. Drink plenty of fluids to keep your urine clear or pale yellow. °· Take warm sitz baths to soothe any pain or discomfort caused by hemorrhoids. Use hemorrhoid cream if your caregiver approves. °· If you develop varicose veins, wear support hose. Elevate your feet for 15 minutes, 3 4 times a day. Limit salt in your diet. °· Avoid heavy lifting, wear low heal shoes, and practice good posture. °· Rest a lot with your legs elevated if you have leg cramps or low back pain. °· Visit your dentist if you have not gone during your pregnancy. Use a soft toothbrush to brush your teeth and be gentle when you floss. °· A sexual relationship may be continued unless your caregiver directs you otherwise. °· Do not travel far distances unless it is absolutely necessary and only with the approval of your caregiver. °· Take prenatal classes to understand, practice, and ask questions about the labor and delivery. °· Make a trial run to the hospital. °· Pack your hospital bag. °· Prepare the baby's nursery. °· Continue to go to all your prenatal visits as directed  by your caregiver. °SEEK MEDICAL CARE IF: °· You are unsure if you are in labor or if your water has broken. °· You have dizziness. °· You have mild pelvic cramps, pelvic pressure, or nagging pain in your abdominal area. °· You have persistent nausea, vomiting, or diarrhea. °· You have a bad smelling vaginal discharge. °· You have pain with urination. °SEEK IMMEDIATE MEDICAL CARE IF:  °· You have a fever. °· You are leaking fluid from your vagina. °· You have spotting or bleeding from your vagina. °· You have severe abdominal cramping or pain. °· You have rapid weight loss or gain. °· You have shortness of breath with chest pain. °· You notice sudden or extreme swelling of your face, hands, ankles, feet, or legs. °· You have not felt your baby move in over an hour. °· You have severe headaches that do not go away with medicine. °· You have vision changes. °Document Released: 09/08/2001 Document Revised: 05/17/2013 Document Reviewed:   You have severe abdominal cramping or pain.   You have rapid weight loss or gain.   You have shortness of breath with chest pain.   You notice sudden or extreme swelling of your face, hands, ankles, feet, or legs.   You have not felt your baby move in over an hour.   You have severe headaches that do not go away with medicine.   You have vision changes.  Document Released: 09/08/2001 Document Revised: 05/17/2013 Document Reviewed: 11/15/2012  [employer] Patient Information 2014 [employer]

## 2013-12-15 NOTE — MAU Note (Signed)
Pt states around 1245 this am began feeling sharp pain into her back. Denies uti s/s. Is having ctx's. Was three cm Tuesday in office and 60% effaced.

## 2013-12-15 NOTE — Progress Notes (Signed)
Notified of pt in MAU for labor eval, cervix unchanged. Oredrs to walk x1 hour then recheck and cb w results.

## 2013-12-15 NOTE — MAU Note (Signed)
Patient states she is having back pain that comes and goes, not sure if contractions. Denies bleeding or leaking and has passed some mucus plug. Reports good fetal movement.

## 2013-12-19 ENCOUNTER — Telehealth (HOSPITAL_COMMUNITY): Payer: Self-pay | Admitting: *Deleted

## 2013-12-19 ENCOUNTER — Encounter (HOSPITAL_COMMUNITY): Payer: Self-pay | Admitting: *Deleted

## 2013-12-19 NOTE — Telephone Encounter (Signed)
Preadmission screen  

## 2013-12-20 ENCOUNTER — Telehealth (HOSPITAL_COMMUNITY): Payer: Self-pay | Admitting: *Deleted

## 2013-12-20 ENCOUNTER — Encounter (HOSPITAL_COMMUNITY): Payer: Self-pay | Admitting: *Deleted

## 2013-12-20 NOTE — Telephone Encounter (Signed)
Preadmission screen  

## 2013-12-21 ENCOUNTER — Observation Stay (HOSPITAL_COMMUNITY)
Admission: RE | Admit: 2013-12-21 | Discharge: 2013-12-21 | DRG: 782 | Disposition: A | Discharge status: Home / Self Care | Payer: Managed Care, Other (non HMO) | Source: Ambulatory Visit | Attending: Obstetrics and Gynecology | Admitting: Obstetrics and Gynecology

## 2013-12-21 ENCOUNTER — Encounter (HOSPITAL_COMMUNITY): Payer: Self-pay

## 2013-12-21 DIAGNOSIS — Z3490 Encounter for supervision of normal pregnancy, unspecified, unspecified trimester: Secondary | ICD-10-CM

## 2013-12-21 DIAGNOSIS — O321XX0 Maternal care for breech presentation, not applicable or unspecified: Principal | ICD-10-CM | POA: Diagnosis present

## 2013-12-21 DIAGNOSIS — O360990 Maternal care for other rhesus isoimmunization, unspecified trimester, not applicable or unspecified: Secondary | ICD-10-CM | POA: Insufficient documentation

## 2013-12-21 DIAGNOSIS — O9989 Other specified diseases and conditions complicating pregnancy, childbirth and the puerperium: Secondary | ICD-10-CM

## 2013-12-21 DIAGNOSIS — O3660X0 Maternal care for excessive fetal growth, unspecified trimester, not applicable or unspecified: Secondary | ICD-10-CM | POA: Insufficient documentation

## 2013-12-21 DIAGNOSIS — O99891 Other specified diseases and conditions complicating pregnancy: Secondary | ICD-10-CM | POA: Insufficient documentation

## 2013-12-21 DIAGNOSIS — Z86718 Personal history of other venous thrombosis and embolism: Secondary | ICD-10-CM | POA: Insufficient documentation

## 2013-12-21 DIAGNOSIS — O409XX0 Polyhydramnios, unspecified trimester, not applicable or unspecified: Secondary | ICD-10-CM | POA: Insufficient documentation

## 2013-12-21 DIAGNOSIS — Z22330 Carrier of Group B streptococcus: Secondary | ICD-10-CM | POA: Insufficient documentation

## 2013-12-21 LAB — CBC
HCT: 27.2 % — ABNORMAL LOW (ref 36.0–46.0)
Hemoglobin: 9 g/dL — ABNORMAL LOW (ref 12.0–15.0)
MCH: 26.9 pg (ref 26.0–34.0)
MCHC: 33.1 g/dL (ref 30.0–36.0)
MCV: 81.4 fL (ref 78.0–100.0)
PLATELETS: 223 10*3/uL (ref 150–400)
RBC: 3.34 MIL/uL — ABNORMAL LOW (ref 3.87–5.11)
RDW: 13.7 % (ref 11.5–15.5)
WBC: 8.2 10*3/uL (ref 4.0–10.5)

## 2013-12-21 LAB — RPR: RPR Ser Ql: NONREACTIVE

## 2013-12-21 LAB — TYPE AND SCREEN
ABO/RH(D): A NEG
ANTIBODY SCREEN: NEGATIVE

## 2013-12-21 MED ORDER — OXYCODONE-ACETAMINOPHEN 5-325 MG PO TABS: 1.0000 | ORAL_TABLET | ORAL | Status: DC | PRN

## 2013-12-21 MED ORDER — ACETAMINOPHEN 325 MG PO TABS: 650.0000 mg | ORAL_TABLET | ORAL | Status: DC | PRN

## 2013-12-21 MED ORDER — CLINDAMYCIN PHOSPHATE 900 MG/50ML IV SOLN
900.0000 mg | Freq: Three times a day (TID) | INTRAVENOUS | Status: DC
  Administered 2013-12-21: 900 mg via INTRAVENOUS
  Filled 2013-12-21 (×2): qty 50

## 2013-12-21 MED ORDER — TERBUTALINE SULFATE 1 MG/ML IJ SOLN: 0.2500 mg | Freq: Once | INTRAMUSCULAR | Status: DC | PRN

## 2013-12-21 MED ORDER — LACTATED RINGERS IV SOLN: 500.0000 mL | INTRAVENOUS | Status: DC | PRN

## 2013-12-21 MED ORDER — LIDOCAINE HCL (PF) 1 % IJ SOLN: 30.0000 mL | INTRAMUSCULAR | Status: DC | PRN

## 2013-12-21 MED ORDER — OXYTOCIN BOLUS FROM INFUSION
500.0000 mL | INTRAVENOUS | Status: DC
Start: 2013-12-21 — End: 2013-12-21

## 2013-12-21 MED ORDER — OXYTOCIN 40 UNITS IN LACTATED RINGERS INFUSION - SIMPLE MED: 62.5000 mL/h | INTRAVENOUS | Status: DC

## 2013-12-21 MED ORDER — IBUPROFEN 600 MG PO TABS
600.0000 mg | ORAL_TABLET | Freq: Four times a day (QID) | ORAL | Status: DC | PRN
Start: 2013-12-21 — End: 2013-12-21

## 2013-12-21 MED ORDER — CITRIC ACID-SODIUM CITRATE 334-500 MG/5ML PO SOLN: 30.0000 mL | ORAL | Status: DC | PRN

## 2013-12-21 MED ORDER — OXYTOCIN 40 UNITS IN LACTATED RINGERS INFUSION - SIMPLE MED
1.0000 m[IU]/min | INTRAVENOUS | Status: DC
  Filled 2013-12-21: qty 1000

## 2013-12-21 MED ORDER — ONDANSETRON HCL 4 MG/2ML IJ SOLN: 4.0000 mg | Freq: Four times a day (QID) | INTRAMUSCULAR | Status: DC | PRN

## 2013-12-21 MED ORDER — LACTATED RINGERS IV SOLN
INTRAVENOUS | Status: DC
  Administered 2013-12-21: 08:00:00 via INTRAVENOUS

## 2013-12-21 MED ORDER — FLEET ENEMA 7-19 GM/118ML RE ENEM: 1.0000 | ENEMA | RECTAL | Status: DC | PRN

## 2013-12-21 NOTE — Progress Notes (Signed)
Provider called to bedside due to RN not being about to determine presentation--Bedside US performed fetus found to be frank breech. Options discussed with pt--pt decided to go with scheduled c-sections scheduled for Saturday morning--discharge orders given

## 2013-12-21 NOTE — H&P (Addendum)
[name] is a 28 y.o. female G3P1011 BF @ 40 2/[redacted] weeks gestation presents for IOL due to fetal macrosomia and favorable cervix. PNC complicated by polyhydramnios which resolved on last study. EFW 9lb 3 oz (+) GBS  Maternal Medical History:  Reason for admission: Contractions.   Contractions: Frequency: irregular.    Fetal activity: Perceived fetal activity is normal.    Prenatal complications: Polyhydramnios.   Prenatal Complications - Diabetes: none.    OB History   Grav Para Term Preterm Abortions TAB SAB Ect Mult Living   3 1 1  1  0 1 0 0 1     Past Medical History  Diagnosis Date  . Asthma   . Low BMI 2007    During pregnancy  . H/O pyelonephritis   . H/O acute respiratory distress syndrome     With a history of septic shock  . Hoarseness of voice     intermittent  . DVT (deep venous thrombosis), right   . Bronchitis, asthmatic   . Hx: UTI (urinary tract infection)     x 1  . H/O varicella   . Hepatitis     vaccine x3  . Yeast infection   . H/O dysmenorrhea 2005  . H/O septic shock 05/2006  . Bilateral leg numbness 05/24/2007  . FHx: heart disease   . FHx: thyroid disease   . FHx: cancer   . FHx: diabetes mellitus   . FHx: migraine headaches   . FHx: stroke   . FHx: kidney disease    Past Surgical History  Procedure Laterality Date  . Wisdom tooth extraction      x 4   Family History: family history includes COPD in her maternal grandmother; Cancer in her paternal grandfather; Diabetes in her maternal grandmother; Fibroids in her mother and paternal aunt; Heart disease in her maternal grandmother; Hyperlipidemia in her father and mother; Hypertension in her father, maternal grandfather, and mother; Kidney disease in her maternal grandfather; Mental retardation in her paternal aunt; Stroke in her maternal grandfather. Social History:  reports that she has never smoked. She has never used smokeless tobacco. She reports that she does not drink alcohol or use  illicit drugs.   Prenatal Transfer Tool  Maternal Diabetes: No Genetic Screening: Normal Maternal Ultrasounds/Referrals: Normal Fetal Ultrasounds or other Referrals:  None Maternal Substance Abuse:  No Significant Maternal Medications:  None Significant Maternal Lab Results:  Lab values include: Group B Strep positive, Rh negative Other Comments:  polyhydramnios, fetal macrosomia  Review of Systems  All other systems reviewed and are negative.      Blood pressure 117/69, pulse 108, temperature 98.2 F (36.8 C), temperature source Oral, resp. rate 16, height 5\' 4"  (1.626 m), weight 65.772 kg (145 lb), last menstrual period 01/25/2013. Exam Physical Exam  Constitutional: She is oriented to person, place, and time. She appears well-developed and well-nourished.  HENT:  Head: Atraumatic.  Eyes: EOM are normal.  Neck: Neck supple.  Cardiovascular: Regular rhythm.   GI: Soft.  Musculoskeletal: She exhibits no edema.  Neurological: She is alert and oriented to person, place, and time.  Skin: Skin is warm and dry.  Psychiatric: She has a normal mood and affect.   VE: 4/60%/OOP/  Tracing: baseline 150 (+) accels to 170-175  Irregular ctx  Prenatal labs: ABO, Rh: A negative Antibody: Negative (08/14 0000) Rubella: Immune (08/14 0000) RPR: Nonreactive (08/14 0000)  HBsAg: Negative (08/14 0000)  HIV: Non-reactive (08/14 0000)  GBS: Positive (02/24  0000)   Assessment/Plan: Fetal macrosomia Rh negative Term gestation with favorable cervix GBS cx (+)  P) Admit routine labs Pitocin epidural. Amniotomy prn. IV clindamycin. Rhophylac postpartum if indicated   [name] 12/21/2013, 8:44 AM   Addendum: Bedside sonogram  Done by me  for presentation: Pilar Plate breech noted.  Pt given option of ECV followed by amniotomy and proceeding with IOL or schedule Primary C/S. Pt opts for the latter however due to time constraint, C/S cannot be done today.  Pt given labor  precautions and urge to come to hospital ASAP if SROM or labor. Risk of C/S reviewed with pt: infection, bleeding, possible need for transfusion and its risk, internal scar tissue

## 2013-12-22 ENCOUNTER — Other Ambulatory Visit: Payer: Self-pay | Admitting: Obstetrics and Gynecology

## 2013-12-23 ENCOUNTER — Encounter (HOSPITAL_COMMUNITY): Payer: Managed Care, Other (non HMO) | Admitting: Anesthesiology

## 2013-12-23 ENCOUNTER — Encounter (HOSPITAL_COMMUNITY)
Admission: RE | Disposition: A | Discharge status: Home / Self Care | Payer: Self-pay | Source: Ambulatory Visit | Attending: Obstetrics and Gynecology

## 2013-12-23 ENCOUNTER — Inpatient Hospital Stay (HOSPITAL_COMMUNITY): Admission: AD | Admit: 2013-12-23 | Payer: Private Health Insurance - Indemnity | Admitting: Obstetrics and Gynecology

## 2013-12-23 ENCOUNTER — Inpatient Hospital Stay (HOSPITAL_COMMUNITY): Payer: Managed Care, Other (non HMO) | Admitting: Anesthesiology

## 2013-12-23 ENCOUNTER — Inpatient Hospital Stay (HOSPITAL_COMMUNITY)
Admission: RE | Admit: 2013-12-23 | Discharge: 2013-12-25 | DRG: 775 | Disposition: A | Discharge status: Home / Self Care | Payer: Managed Care, Other (non HMO) | Source: Ambulatory Visit | Attending: Obstetrics and Gynecology | Admitting: Obstetrics and Gynecology

## 2013-12-23 ENCOUNTER — Encounter (HOSPITAL_COMMUNITY): Payer: Self-pay | Admitting: *Deleted

## 2013-12-23 DIAGNOSIS — Z22330 Carrier of Group B streptococcus: Secondary | ICD-10-CM

## 2013-12-23 DIAGNOSIS — O99892 Other specified diseases and conditions complicating childbirth: Secondary | ICD-10-CM | POA: Diagnosis present

## 2013-12-23 DIAGNOSIS — O9989 Other specified diseases and conditions complicating pregnancy, childbirth and the puerperium: Secondary | ICD-10-CM

## 2013-12-23 DIAGNOSIS — O3660X0 Maternal care for excessive fetal growth, unspecified trimester, not applicable or unspecified: Principal | ICD-10-CM | POA: Diagnosis present

## 2013-12-23 DIAGNOSIS — O320XX0 Maternal care for unstable lie, not applicable or unspecified: Secondary | ICD-10-CM | POA: Diagnosis present

## 2013-12-23 LAB — CBC
HEMATOCRIT: 30.4 % — AB (ref 36.0–46.0)
Hemoglobin: 10.1 g/dL — ABNORMAL LOW (ref 12.0–15.0)
MCH: 27 pg (ref 26.0–34.0)
MCHC: 33.2 g/dL (ref 30.0–36.0)
MCV: 81.3 fL (ref 78.0–100.0)
Platelets: 249 10*3/uL (ref 150–400)
RBC: 3.74 MIL/uL — AB (ref 3.87–5.11)
RDW: 13.7 % (ref 11.5–15.5)
WBC: 9 10*3/uL (ref 4.0–10.5)

## 2013-12-23 LAB — RPR: RPR Ser Ql: NONREACTIVE

## 2013-12-23 LAB — TYPE AND SCREEN
ABO/RH(D): A NEG
Antibody Screen: NEGATIVE

## 2013-12-23 SURGERY — Surgical Case
Anesthesia: Spinal

## 2013-12-23 MED ORDER — LACTATED RINGERS IV SOLN: 500.0000 mL | Freq: Once | INTRAVENOUS | Status: DC

## 2013-12-23 MED ORDER — EPHEDRINE 5 MG/ML INJ
10.0000 mg | INTRAVENOUS | Status: AC | PRN
  Administered 2013-12-23 (×2): 10 mg via INTRAVENOUS
  Filled 2013-12-23: qty 4

## 2013-12-23 MED ORDER — LACTATED RINGERS IV SOLN
INTRAVENOUS | Status: DC
  Administered 2013-12-23 (×3): via INTRAVENOUS

## 2013-12-23 MED ORDER — CITRIC ACID-SODIUM CITRATE 334-500 MG/5ML PO SOLN
30.0000 mL | ORAL | Status: DC | PRN
  Administered 2013-12-23: 30 mL via ORAL
  Filled 2013-12-23: qty 15

## 2013-12-23 MED ORDER — ONDANSETRON HCL 4 MG/2ML IJ SOLN: 4.0000 mg | Freq: Four times a day (QID) | INTRAMUSCULAR | Status: DC | PRN

## 2013-12-23 MED ORDER — PHENYLEPHRINE 40 MCG/ML (10ML) SYRINGE FOR IV PUSH (FOR BLOOD PRESSURE SUPPORT)
80.0000 ug | PREFILLED_SYRINGE | INTRAVENOUS | Status: DC | PRN
  Filled 2013-12-23: qty 10
  Filled 2013-12-23: qty 2

## 2013-12-23 MED ORDER — OXYTOCIN 40 UNITS IN LACTATED RINGERS INFUSION - SIMPLE MED
62.5000 mL/h | INTRAVENOUS | Status: DC
  Filled 2013-12-23: qty 1000

## 2013-12-23 MED ORDER — FENTANYL 2.5 MCG/ML BUPIVACAINE 1/10 % EPIDURAL INFUSION (WH - ANES)
14.0000 mL/h | INTRAMUSCULAR | Status: DC | PRN
  Administered 2013-12-23 (×2): 14 mL/h via EPIDURAL
  Filled 2013-12-23 (×2): qty 125

## 2013-12-23 MED ORDER — ACETAMINOPHEN 325 MG PO TABS: 650.0000 mg | ORAL_TABLET | ORAL | Status: DC | PRN

## 2013-12-23 MED ORDER — SODIUM BICARBONATE 8.4 % IV SOLN
INTRAVENOUS | Status: DC | PRN
  Administered 2013-12-23: 5 mL via EPIDURAL

## 2013-12-23 MED ORDER — OXYTOCIN BOLUS FROM INFUSION: 500.0000 mL | INTRAVENOUS | Status: DC

## 2013-12-23 MED ORDER — LIDOCAINE HCL (PF) 1 % IJ SOLN
30.0000 mL | INTRAMUSCULAR | Status: DC | PRN
  Administered 2013-12-23: 30 mL via SUBCUTANEOUS
  Filled 2013-12-23: qty 30

## 2013-12-23 MED ORDER — TERBUTALINE SULFATE 1 MG/ML IJ SOLN: 0.2500 mg | Freq: Once | INTRAMUSCULAR | Status: DC | PRN

## 2013-12-23 MED ORDER — OXYTOCIN 10 UNIT/ML IJ SOLN: 10.0000 [IU] | Freq: Once | INTRAMUSCULAR | Status: DC

## 2013-12-23 MED ORDER — CLINDAMYCIN PHOSPHATE 900 MG/50ML IV SOLN
900.0000 mg | Freq: Once | INTRAVENOUS | Status: AC
  Administered 2013-12-23: 900 mg via INTRAVENOUS
  Filled 2013-12-23: qty 50

## 2013-12-23 MED ORDER — OXYTOCIN 40 UNITS IN LACTATED RINGERS INFUSION - SIMPLE MED
1.0000 m[IU]/min | INTRAVENOUS | Status: DC
Start: 2013-12-23 — End: 2013-12-24
  Administered 2013-12-23: 2 m[IU]/min via INTRAVENOUS

## 2013-12-23 MED ORDER — LACTATED RINGERS IV SOLN: 500.0000 mL | INTRAVENOUS | Status: DC | PRN

## 2013-12-23 MED ORDER — PHENYLEPHRINE 40 MCG/ML (10ML) SYRINGE FOR IV PUSH (FOR BLOOD PRESSURE SUPPORT)
80.0000 ug | PREFILLED_SYRINGE | INTRAVENOUS | Status: DC | PRN
  Administered 2013-12-23: 80 ug via INTRAVENOUS
  Filled 2013-12-23: qty 2

## 2013-12-23 MED ORDER — EPHEDRINE 5 MG/ML INJ
10.0000 mg | INTRAVENOUS | Status: DC | PRN
  Filled 2013-12-23: qty 2

## 2013-12-23 MED ORDER — DIPHENHYDRAMINE HCL 50 MG/ML IJ SOLN: 12.5000 mg | INTRAMUSCULAR | Status: DC | PRN

## 2013-12-23 MED ORDER — ZOLPIDEM TARTRATE 5 MG PO TABS: 5.0000 mg | ORAL_TABLET | Freq: Every evening | ORAL | Status: DC | PRN

## 2013-12-23 MED ORDER — LACTATED RINGERS IV SOLN
INTRAVENOUS | Status: DC
  Administered 2013-12-23: 300 mL via INTRAUTERINE

## 2013-12-23 MED ORDER — TERBUTALINE SULFATE 1 MG/ML IJ SOLN: 0.2500 mg | Freq: Once | INTRAMUSCULAR | Status: AC | PRN

## 2013-12-23 MED ORDER — CLINDAMYCIN PHOSPHATE 900 MG/50ML IV SOLN
900.0000 mg | Freq: Three times a day (TID) | INTRAVENOUS | Status: DC
  Administered 2013-12-23: 900 mg via INTRAVENOUS
  Filled 2013-12-23 (×2): qty 50

## 2013-12-23 MED ORDER — IBUPROFEN 600 MG PO TABS: 600.0000 mg | ORAL_TABLET | Freq: Four times a day (QID) | ORAL | Status: DC | PRN

## 2013-12-23 MED ORDER — OXYCODONE-ACETAMINOPHEN 5-325 MG PO TABS: 1.0000 | ORAL_TABLET | ORAL | Status: DC | PRN

## 2013-12-23 SURGICAL SUPPLY — 45 items
APL SKNCLS STERI-STRIP NONHPOA (GAUZE/BANDAGES/DRESSINGS)
BARRIER ADHS 3X4 INTERCEED (GAUZE/BANDAGES/DRESSINGS) ×1 IMPLANT
BENZOIN TINCTURE PRP APPL 2/3 (GAUZE/BANDAGES/DRESSINGS) IMPLANT
BRR ADH 4X3 ABS CNTRL BYND (GAUZE/BANDAGES/DRESSINGS)
CLAMP CORD UMBIL (MISCELLANEOUS) IMPLANT
CLOSURE WOUND 1/2 X4 (GAUZE/BANDAGES/DRESSINGS)
CLOTH BEACON ORANGE TIMEOUT ST (SAFETY) ×1 IMPLANT
CONTAINER PREFILL 10% NBF 15ML (MISCELLANEOUS) IMPLANT
DRAPE LG THREE QUARTER DISP (DRAPES) IMPLANT
DRSG OPSITE POSTOP 4X10 (GAUZE/BANDAGES/DRESSINGS) ×1 IMPLANT
DURAPREP 26ML APPLICATOR (WOUND CARE) ×1 IMPLANT
ELECT REM PT RETURN 9FT ADLT (ELECTROSURGICAL)
ELECTRODE REM PT RTRN 9FT ADLT (ELECTROSURGICAL) ×1 IMPLANT
EXTRACTOR VACUUM M CUP 4 TUBE (SUCTIONS) IMPLANT
EXTRACTOR VACUUM M CUP 4' TUBE (SUCTIONS)
GLOVE BIOGEL PI IND STRL 7.0 (GLOVE) ×1 IMPLANT
GLOVE BIOGEL PI INDICATOR 7.0 (GLOVE)
GLOVE ECLIPSE 6.5 STRL STRAW (GLOVE) ×1 IMPLANT
GOWN STRL REUS W/TWL LRG LVL3 (GOWN DISPOSABLE) ×2 IMPLANT
KIT ABG SYR 3ML LUER SLIP (SYRINGE) IMPLANT
NDL HYPO 25X1 1.5 SAFETY (NEEDLE) ×1 IMPLANT
NDL HYPO 25X5/8 SAFETYGLIDE (NEEDLE) IMPLANT
NEEDLE HYPO 25X1 1.5 SAFETY (NEEDLE) IMPLANT
NEEDLE HYPO 25X5/8 SAFETYGLIDE (NEEDLE) IMPLANT
NS IRRIG 1000ML POUR BTL (IV SOLUTION) ×1 IMPLANT
PACK C SECTION WH (CUSTOM PROCEDURE TRAY) ×1 IMPLANT
PAD OB MATERNITY 4.3X12.25 (PERSONAL CARE ITEMS) ×1 IMPLANT
RTRCTR C-SECT PINK 25CM LRG (MISCELLANEOUS) IMPLANT
STAPLER VISISTAT 35W (STAPLE) IMPLANT
STRIP CLOSURE SKIN 1/2X4 (GAUZE/BANDAGES/DRESSINGS) IMPLANT
SUT CHROMIC GUT AB #0 18 (SUTURE) IMPLANT
SUT MNCRL 0 VIOLET CTX 36 (SUTURE) ×3 IMPLANT
SUT MON AB 4-0 PS1 27 (SUTURE) IMPLANT
SUT MONOCRYL 0 CTX 36 (SUTURE)
SUT PLAIN 2 0 (SUTURE)
SUT PLAIN 2 0 XLH (SUTURE) IMPLANT
SUT PLAIN ABS 2-0 CT1 27XMFL (SUTURE) IMPLANT
SUT VIC AB 0 CT1 36 (SUTURE) ×2 IMPLANT
SUT VIC AB 2-0 CT1 27 (SUTURE)
SUT VIC AB 2-0 CT1 TAPERPNT 27 (SUTURE) ×1 IMPLANT
SUT VIC AB 4-0 PS2 27 (SUTURE) IMPLANT
SYR CONTROL 10ML LL (SYRINGE) ×1 IMPLANT
TOWEL OR 17X24 6PK STRL BLUE (TOWEL DISPOSABLE) ×1 IMPLANT
TRAY FOLEY CATH 14FR (SET/KITS/TRAYS/PACK) IMPLANT
WATER STERILE IRR 1000ML POUR (IV SOLUTION) ×1 IMPLANT

## 2013-12-23 NOTE — Anesthesia Procedure Notes (Signed)

## 2013-12-23 NOTE — Progress Notes (Signed)
Code Apgar paged to room 163 but NICU Delivery team was excused right after they arrived in the room since infant was crying vigorously.   Amedeo Gory, MD (Attending Neonatologist)

## 2013-12-23 NOTE — Progress Notes (Signed)
S: c/o right hip pain  O: epidural Pitocin Clindamycin  VE: 9/100/-1/0 ? Left asynclitic  Tracing: baseline 140 rare variable some early decel Ctx q 2-3 /2 mins 200 MVU   ImP: Active phase Fetal macrosomia GBS cx (+) P) cont present management. Right exaggerated sims position with peanut

## 2013-12-23 NOTE — Progress Notes (Signed)
S: comfortable Epidural  O: pitocin 14 miu VE 4/70/-3 vtx applied. midposition Controlled AROM; copious clear fluid IUPC, ISE placed  Tracing reactive baseline 140 Ctx q 2-3 mins   IMP: Arrest of dilation due to suboptimal ctx Fetal macrosomia GBS cx (+) on IV clindamycin P) cont clindamycin. Exaggerated left sims continue increase pitocin

## 2013-12-23 NOTE — H&P (Signed)
CATHARINA PICA is a 28 y.o. female presenting @ 74 4/[redacted] weeks gestation for Primary C/S due to frank breech presentation (+) GBS nl 3hr GTT. Pt declines ECV due to fetal macrosomia. Last sonogram EFW 9lb 3 oz . History OB History   Grav Para Term Preterm Abortions TAB SAB Ect Mult Living   3 1 1  1  0 1 0 0 1     Past Medical History  Diagnosis Date  . Asthma   . Low BMI 2007    During pregnancy  . H/O pyelonephritis   . H/O acute respiratory distress syndrome     With a history of septic shock  . Hoarseness of voice     intermittent  . DVT (deep venous thrombosis), right   . Bronchitis, asthmatic   . Hx: UTI (urinary tract infection)     x 1  . H/O varicella   . Hepatitis     vaccine x3  . Yeast infection   . H/O dysmenorrhea 2005  . H/O septic shock 05/2006  . Bilateral leg numbness 05/24/2007  . FHx: heart disease   . FHx: thyroid disease   . FHx: cancer   . FHx: diabetes mellitus   . FHx: migraine headaches   . FHx: stroke   . FHx: kidney disease    Past Surgical History  Procedure Laterality Date  . Wisdom tooth extraction      x 4   Family History: family history includes COPD in her maternal grandmother; Cancer in her paternal grandfather; Diabetes in her maternal grandmother; Fibroids in her mother and paternal aunt; Heart disease in her maternal grandmother; Hyperlipidemia in her father and mother; Hypertension in her father, maternal grandfather, and mother; Kidney disease in her maternal grandfather; Mental retardation in her paternal aunt; Stroke in her maternal grandfather. Social History:  reports that she has never smoked. She has never used smokeless tobacco. She reports that she does not drink alcohol or use illicit drugs.   Prenatal Transfer Tool  Maternal Diabetes: No Genetic Screening: Normal Maternal Ultrasounds/Referrals: Normal Fetal Ultrasounds or other Referrals:  None Maternal Substance Abuse:  No Significant Maternal Medications:   None Significant Maternal Lab Results:  Lab values include: Group B Strep positive, Rh negative Other Comments:  previous polyhdyramnios  Review of Systems  All other systems reviewed and are negative.    Dilation: 4 Effacement (%): 60 Exam by:: K.Felkle, RN Blood pressure 117/69, pulse 108, temperature 98.2 F (36.8 C), temperature source Oral, resp. rate 16, height 5\' 4"  (1.626 m), weight 65.772 kg (145 lb), last menstrual period 01/25/2013. Exam Physical Exam  Constitutional: She is oriented to person, place, and time. She appears well-developed and well-nourished.  HENT:  Head: Atraumatic.  Eyes: EOM are normal.  Neck: Neck supple.  Cardiovascular: Normal rate and regular rhythm.   Respiratory: Breath sounds normal.  GI: Soft.  gravid  Neurological: She is alert and oriented to person, place, and time.  Skin: Skin is warm and dry. No erythema.  Psychiatric: She has a normal mood and affect.    Prenatal labs: ABO, Rh: --/--/A NEG (03/26 0732) Antibody: NEG (03/26 0732) Rubella: Immune (08/14 0000) RPR: NON REACTIVE (03/26 0735)  HBsAg: Negative (08/14 0000)  HIV: Non-reactive (08/14 0000)  GBS: Positive (02/24 0000)   CBC    Component Value Date/Time   WBC 8.2 12/21/2013 0735   RBC 3.34* 12/21/2013 0735   HGB 9.0* 12/21/2013 0735   HCT 27.2* 12/21/2013 5573  PLT 223 12/21/2013 0735   MCV 81.4 12/21/2013 0735   MCH 26.9 12/21/2013 0735   MCHC 33.1 12/21/2013 0735   RDW 13.7 12/21/2013 0735   LYMPHSABS 1.4 04/13/2012 1111   MONOABS 0.9 04/13/2012 1111   EOSABS 0.0 04/13/2012 1111   BASOSABS 0.0 04/13/2012 1111     Assessment/Plan: Pilar Plate breech Fetal macrosomia Term gestation P) primary Cesarean section. Risk of surgery reviewed including infection, bleeding, possible blood transfusion and its risk, internal scar tissue, option for vaginal delivery in the future, injury to bladder, bowels, ureter   Dianelly Ferran A 12/23/2013, 5:59 AM

## 2013-12-23 NOTE — Progress Notes (Signed)
S: Pushing  O: VE fully  (+) 2 station  Tracing: baseline 120's (+) variable decels  Ctx q 2 mins IUPC in place  IMP: variable decelerations due to cord compression Fetal macrosomia GBS cx (+) P) cont pushing. Amnioinfusion bolus

## 2013-12-23 NOTE — Progress Notes (Signed)
Pt presented for C/S due to frank presentation noted on admit 3/26 for IOL. Bedside sono showed vtx presentation. Given unstable lie, will proceed with induction of labor. Favorable cervix . Pt agrees with plan

## 2013-12-23 NOTE — Anesthesia Preprocedure Evaluation (Signed)

## 2013-12-24 ENCOUNTER — Encounter (HOSPITAL_COMMUNITY): Payer: Self-pay | Admitting: *Deleted

## 2013-12-24 LAB — CBC
HEMATOCRIT: 27.4 % — AB (ref 36.0–46.0)
Hemoglobin: 9 g/dL — ABNORMAL LOW (ref 12.0–15.0)
MCH: 26.6 pg (ref 26.0–34.0)
MCHC: 32.8 g/dL (ref 30.0–36.0)
MCV: 81.1 fL (ref 78.0–100.0)
PLATELETS: 207 10*3/uL (ref 150–400)
RBC: 3.38 MIL/uL — ABNORMAL LOW (ref 3.87–5.11)
RDW: 14 % (ref 11.5–15.5)
WBC: 13.9 10*3/uL — AB (ref 4.0–10.5)

## 2013-12-24 MED ORDER — FERROUS SULFATE 325 (65 FE) MG PO TABS
325.0000 mg | ORAL_TABLET | Freq: Two times a day (BID) | ORAL | Status: DC
  Administered 2013-12-24 – 2013-12-25 (×3): 325 mg via ORAL
  Filled 2013-12-24 (×3): qty 1

## 2013-12-24 MED ORDER — SIMETHICONE 80 MG PO CHEW
80.0000 mg | CHEWABLE_TABLET | ORAL | Status: DC | PRN
  Administered 2013-12-24: 80 mg via ORAL
  Filled 2013-12-24: qty 1

## 2013-12-24 MED ORDER — RHO D IMMUNE GLOBULIN 1500 UNIT/2ML IJ SOLN
300.0000 ug | Freq: Once | INTRAMUSCULAR | Status: AC
  Administered 2013-12-24: 300 ug via INTRAVENOUS
  Filled 2013-12-24: qty 2

## 2013-12-24 MED ORDER — BENZOCAINE-MENTHOL 20-0.5 % EX AERO
1.0000 "application " | INHALATION_SPRAY | CUTANEOUS | Status: DC | PRN
  Administered 2013-12-24 – 2013-12-25 (×2): 1 via TOPICAL
  Filled 2013-12-24 (×2): qty 56

## 2013-12-24 MED ORDER — OXYCODONE-ACETAMINOPHEN 5-325 MG PO TABS
1.0000 | ORAL_TABLET | ORAL | Status: DC | PRN
  Administered 2013-12-24 (×3): 1 via ORAL
  Administered 2013-12-24: 2 via ORAL
  Administered 2013-12-24 (×2): 1 via ORAL
  Filled 2013-12-24 (×4): qty 1
  Filled 2013-12-24: qty 2
  Filled 2013-12-24: qty 1

## 2013-12-24 MED ORDER — PRENATAL MULTIVITAMIN CH
1.0000 | ORAL_TABLET | Freq: Every day | ORAL | Status: DC
  Administered 2013-12-24: 1 via ORAL
  Filled 2013-12-24: qty 1

## 2013-12-24 MED ORDER — ONDANSETRON HCL 4 MG/2ML IJ SOLN: 4.0000 mg | INTRAMUSCULAR | Status: DC | PRN

## 2013-12-24 MED ORDER — LANOLIN HYDROUS EX OINT: TOPICAL_OINTMENT | CUTANEOUS | Status: DC | PRN

## 2013-12-24 MED ORDER — WITCH HAZEL-GLYCERIN EX PADS: 1.0000 "application " | MEDICATED_PAD | CUTANEOUS | Status: DC | PRN

## 2013-12-24 MED ORDER — DIPHENHYDRAMINE HCL 25 MG PO CAPS: 25.0000 mg | ORAL_CAPSULE | Freq: Four times a day (QID) | ORAL | Status: DC | PRN

## 2013-12-24 MED ORDER — ZOLPIDEM TARTRATE 5 MG PO TABS: 5.0000 mg | ORAL_TABLET | Freq: Every evening | ORAL | Status: DC | PRN

## 2013-12-24 MED ORDER — IBUPROFEN 600 MG PO TABS
600.0000 mg | ORAL_TABLET | Freq: Four times a day (QID) | ORAL | Status: DC
  Administered 2013-12-24 – 2013-12-25 (×7): 600 mg via ORAL
  Filled 2013-12-24 (×7): qty 1

## 2013-12-24 MED ORDER — SENNOSIDES-DOCUSATE SODIUM 8.6-50 MG PO TABS
2.0000 | ORAL_TABLET | ORAL | Status: DC
  Administered 2013-12-24 (×2): 2 via ORAL
  Filled 2013-12-24 (×2): qty 2

## 2013-12-24 MED ORDER — DIBUCAINE 1 % RE OINT: 1.0000 "application " | TOPICAL_OINTMENT | RECTAL | Status: DC | PRN

## 2013-12-24 MED ORDER — ONDANSETRON HCL 4 MG PO TABS: 4.0000 mg | ORAL_TABLET | ORAL | Status: DC | PRN

## 2013-12-24 NOTE — Progress Notes (Signed)
Patient ID: [name], female   DOB: 01-May-1986, 28 y.o.   MRN: [id number] PPD # 1 SVD  S:  Reports feeling well             Tolerating po/ No nausea or vomiting             Bleeding is moderate             Pain controlled with ibuprofen (OTC)             Up ad lib / ambulatory / voiding without difficulties    Newborn  Information for the patient's newborn:  Aaminah, Forrester [[id number]]  female  breast and bottle feeding   O:  A & O x 3, in no apparent distress              VS:  Filed Vitals:   12/23/13 2300 12/23/13 2342 12/24/13 0048 12/24/13 0544  BP: 113/70 102/70 111/72 104/74  Pulse: 103 96 99 85  Temp:  98.1 F (36.7 C) 97.9 F (36.6 C) 98 F (36.7 C)  TempSrc:  Oral  Axillary  Resp: 18 18 20 18   Height:      Weight:      SpO2:  96% 96% 97%    LABS:  Recent Labs  12/23/13 0950 12/24/13 0630  WBC 9.0 13.9*  HGB 10.1* 9.0*  HCT 30.4* 27.4*  PLT 249 207    Blood type: --/--/A NEG (03/29 0630)  Rubella: Immune (08/14 0000)   I&O: I/O last 3 completed shifts: In: -  Out: 1550 [Urine:1300; Blood:250]             Lungs: Clear and unlabored  Heart: regular rate and rhythm / no murmurs  Abdomen: soft, non-tender, non-distended              Fundus: firm, non-tender, U-1  Perineum: 2nd degree repair healing, no edema  Lochia: moderate, no clots  Extremities: no edema, no calf pain or tenderness, no Homans    A/P: PPD # 1  28 y.o., [id number]   Principal Problem:    Postpartum care following vaginal delivery (3/28)    Doing well - stable status  Routine post partum orders  Anticipate discharge tomorrow    Laury Deep, M, MSN, CNM 12/24/2013, 10:35 AM

## 2013-12-24 NOTE — Addendum Note (Signed)
Addendum created 12/24/13 1700 by Garner Nash, CRNA   Modules edited: Charges VN, Notes Section   Notes Section:  File: [id number]

## 2013-12-24 NOTE — Lactation Note (Signed)
This note was copied from the chart of Shirley Eulamae Greenstein. Lactation Consultation Note Initial consult:  Ex BF P2, Reviewed LC/OP services and brochure. Room full of visitors.  Mother denies problems or questions at this time. Patient Name: [name] [id number] Date: 12/24/2013 Reason for consult: Initial assessment   Maternal Data Does the patient have breastfeeding experience prior to this delivery?: Yes  Feeding Length of feed: 10 min  LATCH Score/Interventions                      Lactation Tools Discussed/Used     Consult Status Consult Status: Follow-up Date: 12/25/13 Follow-up type: In-patient    Vivianne Master [facility] 12/24/2013, 2:51 PM

## 2013-12-24 NOTE — Anesthesia Postprocedure Evaluation (Signed)
  Anesthesia Post-op Note  Patient: [name] Doctor  Procedure(s) Performed: * No procedures listed *  Patient Location: Mother/Baby  Anesthesia Type:Epidural  Level of Consciousness: awake and alert   Airway and Oxygen Therapy: Patient Spontanous Breathing  Post-op Pain: mild  Post-op Assessment: Patient's Cardiovascular Status Stable, Respiratory Function Stable, No signs of Nausea or vomiting, Pain level controlled, No headache, No residual numbness and No residual motor weakness  Post-op Vital Signs: stable  Complications: No apparent anesthesia complications

## 2013-12-25 LAB — RH IG WORKUP (INCLUDES ABO/RH)
ABO/RH(D): A NEG
FETAL SCREEN: NEGATIVE
Gestational Age(Wks): 39.4
UNIT DIVISION: 0

## 2013-12-25 MED ORDER — DOCUSATE SODIUM 100 MG PO CAPS: 100.0000 mg | ORAL_CAPSULE | Freq: Every day | ORAL | Status: DC

## 2013-12-25 MED ORDER — IBUPROFEN 800 MG PO TABS: 800.0000 mg | ORAL_TABLET | Freq: Three times a day (TID) | ORAL | Status: DC | PRN

## 2013-12-25 MED ORDER — OXYCODONE-ACETAMINOPHEN 5-325 MG PO TABS: 1.0000 | ORAL_TABLET | ORAL | Status: DC | PRN

## 2013-12-25 MED ORDER — MAGNESIUM 250 MG PO TABS: 1.0000 | ORAL_TABLET | Freq: Every day | ORAL | Status: DC

## 2013-12-25 NOTE — Progress Notes (Signed)
PPD 2 SVD  S:  Reports feeling well - ready to go home             Tolerating po/ No nausea or vomiting             Bleeding is light             Pain controlled with motrin and percocet - would like stronger motrin dose             Up ad lib / ambulatory / voiding QS  Newborn breast feeding  / female O:               VS: BP 97/63  Pulse 83  Temp(Src) 97.6 F (36.4 C) (Oral)  Resp 18  Ht 5\' 4"  (1.626 m)  Wt 65.772 kg (145 lb)  BMI 24.88 kg/m2  SpO2 99%  LMP 01/25/2013  Breastfeeding? Unknown   LABS:              Recent Labs  12/23/13 0950 12/24/13 0630  WBC 9.0 13.9*  HGB 10.1* 9.0*  PLT 249 207               Blood type: --/--/A NEG (03/29 0630)  Rubella: Immune (08/14 0000)                      Physical Exam:             Alert and oriented X3  Abdomen: soft, non-tender, non-distended              Fundus: firm, non-tender, U-1  Perineum: no edema  Lochia: light  Extremities: no edema, no calf pain or tenderness    A: PPD # 2   Doing well - stable status  P: Routine post partum orders  DC home  Artelia Laroche CNM, MSN, [facility] 12/25/2013, 10:43 AM

## 2013-12-25 NOTE — Lactation Note (Addendum)
This note was copied from the chart of [name]. Lactation Consultation Note  Patient Name: [name] [id number] Date: 12/25/2013 Reason for consult: Follow-up assessment  Per mom breast feeding is going so much better than my 1st one. Per mom  My right nipple tender , LC reviewed basics - breast massage , hand express,  pre - pump if needed and latch with breast compressions until comfort is achieved. Reviewed sore nipple and engorgement prevention and tx. Instructed mom on the use of comfort  Gels. Presently the baby sound asleep . Mom aware she can call for latch check.    Maternal Data Formula Feeding for Exclusion: Yes Reason for exclusion: Mother's choice to formula and breast feed on admission Has patient been taught Hand Expression?:  (per mom familiar with hand expressing )  Feeding    LATCH Score/Interventions                Intervention(s): Breastfeeding basics reviewed     Lactation Tools Discussed/Used Tools:  (per mom has a DEBP Ameda pump at home )   Consult Status Consult Status: Complete    Myer Haff 12/25/2013, 9:17 AM

## 2013-12-31 NOTE — Discharge Summary (Signed)
Obstetric Discharge Summary  Reason for Admission: induction of labor - cephalic presentation at time of scheduled CS Prenatal Procedures: NST and ultrasound Intrapartum Procedures: spontaneous vaginal delivery and GBS prophylaxis Postpartum Procedures: none Complications-Operative and Postpartum: 2nd degree perineal laceration Hemoglobin  Date Value Ref Range Status  12/24/2013 9.0* 12.0 - 15.0 g/dL Final     HCT  Date Value Ref Range Status  12/24/2013 27.4* 36.0 - 46.0 % Final    Physical Exam:  General: alert, cooperative and no distress Lochia: appropriate Uterine Fundus: firm Incision: healing well DVT Evaluation: No evidence of DVT seen on physical exam.  Discharge Diagnoses: Term Pregnancy-delivered  Discharge Information: Date: 12/31/2013 Activity: pelvic rest Diet: routine Medications: PNV / motrin / percocet / iron Condition: stable Instructions: refer to practice specific booklet Discharge to: home   Newborn Data: Live born female  Birth Weight: 8 lb 8.4 oz (3867 g) APGAR: 9, 9  Home with mother.  Artelia Laroche 12/31/2013, 10:34 AM

## 2014-07-30 ENCOUNTER — Encounter (HOSPITAL_COMMUNITY): Payer: Self-pay | Admitting: *Deleted

## 2014-10-11 ENCOUNTER — Encounter (HOSPITAL_COMMUNITY): Payer: Self-pay | Admitting: Obstetrics and Gynecology

## 2015-03-07 ENCOUNTER — Encounter (HOSPITAL_COMMUNITY): Payer: Self-pay | Admitting: Obstetrics and Gynecology

## 2016-01-08 DIAGNOSIS — J3081 Allergic rhinitis due to animal (cat) (dog) hair and dander: Secondary | ICD-10-CM | POA: Diagnosis not present

## 2016-01-08 DIAGNOSIS — J301 Allergic rhinitis due to pollen: Secondary | ICD-10-CM | POA: Diagnosis not present

## 2016-01-08 DIAGNOSIS — J3089 Other allergic rhinitis: Secondary | ICD-10-CM | POA: Diagnosis not present

## 2016-01-15 DIAGNOSIS — J301 Allergic rhinitis due to pollen: Secondary | ICD-10-CM | POA: Diagnosis not present

## 2016-01-15 DIAGNOSIS — J3081 Allergic rhinitis due to animal (cat) (dog) hair and dander: Secondary | ICD-10-CM | POA: Diagnosis not present

## 2016-01-15 DIAGNOSIS — J3089 Other allergic rhinitis: Secondary | ICD-10-CM | POA: Diagnosis not present

## 2016-01-28 DIAGNOSIS — J4520 Mild intermittent asthma, uncomplicated: Secondary | ICD-10-CM | POA: Diagnosis not present

## 2016-01-28 DIAGNOSIS — J3081 Allergic rhinitis due to animal (cat) (dog) hair and dander: Secondary | ICD-10-CM | POA: Diagnosis not present

## 2016-01-28 DIAGNOSIS — J3089 Other allergic rhinitis: Secondary | ICD-10-CM | POA: Diagnosis not present

## 2016-01-28 DIAGNOSIS — J301 Allergic rhinitis due to pollen: Secondary | ICD-10-CM | POA: Diagnosis not present

## 2016-02-14 DIAGNOSIS — J3089 Other allergic rhinitis: Secondary | ICD-10-CM | POA: Diagnosis not present

## 2016-02-14 DIAGNOSIS — J3081 Allergic rhinitis due to animal (cat) (dog) hair and dander: Secondary | ICD-10-CM | POA: Diagnosis not present

## 2016-02-14 DIAGNOSIS — J301 Allergic rhinitis due to pollen: Secondary | ICD-10-CM | POA: Diagnosis not present

## 2016-02-27 DIAGNOSIS — J3081 Allergic rhinitis due to animal (cat) (dog) hair and dander: Secondary | ICD-10-CM | POA: Diagnosis not present

## 2016-02-27 DIAGNOSIS — J3089 Other allergic rhinitis: Secondary | ICD-10-CM | POA: Diagnosis not present

## 2016-02-27 DIAGNOSIS — J301 Allergic rhinitis due to pollen: Secondary | ICD-10-CM | POA: Diagnosis not present

## 2016-02-27 LAB — HM PAP SMEAR: HM PAP: NORMAL

## 2016-03-02 DIAGNOSIS — H0015 Chalazion left lower eyelid: Secondary | ICD-10-CM | POA: Diagnosis not present

## 2016-03-02 DIAGNOSIS — Z681 Body mass index (BMI) 19 or less, adult: Secondary | ICD-10-CM | POA: Diagnosis not present

## 2016-03-12 DIAGNOSIS — J3081 Allergic rhinitis due to animal (cat) (dog) hair and dander: Secondary | ICD-10-CM | POA: Diagnosis not present

## 2016-03-12 DIAGNOSIS — J301 Allergic rhinitis due to pollen: Secondary | ICD-10-CM | POA: Diagnosis not present

## 2016-03-12 DIAGNOSIS — J3089 Other allergic rhinitis: Secondary | ICD-10-CM | POA: Diagnosis not present

## 2016-03-20 DIAGNOSIS — Z01419 Encounter for gynecological examination (general) (routine) without abnormal findings: Secondary | ICD-10-CM | POA: Diagnosis not present

## 2016-03-20 DIAGNOSIS — Z1151 Encounter for screening for human papillomavirus (HPV): Secondary | ICD-10-CM | POA: Diagnosis not present

## 2016-03-20 DIAGNOSIS — Z681 Body mass index (BMI) 19 or less, adult: Secondary | ICD-10-CM | POA: Diagnosis not present

## 2016-04-01 DIAGNOSIS — J3081 Allergic rhinitis due to animal (cat) (dog) hair and dander: Secondary | ICD-10-CM | POA: Diagnosis not present

## 2016-04-01 DIAGNOSIS — J301 Allergic rhinitis due to pollen: Secondary | ICD-10-CM | POA: Diagnosis not present

## 2016-04-01 DIAGNOSIS — J3089 Other allergic rhinitis: Secondary | ICD-10-CM | POA: Diagnosis not present

## 2016-04-17 DIAGNOSIS — J3081 Allergic rhinitis due to animal (cat) (dog) hair and dander: Secondary | ICD-10-CM | POA: Diagnosis not present

## 2016-04-17 DIAGNOSIS — J301 Allergic rhinitis due to pollen: Secondary | ICD-10-CM | POA: Diagnosis not present

## 2016-04-17 DIAGNOSIS — J3089 Other allergic rhinitis: Secondary | ICD-10-CM | POA: Diagnosis not present

## 2016-05-05 ENCOUNTER — Telehealth: Payer: Self-pay | Admitting: *Deleted

## 2016-05-05 ENCOUNTER — Encounter: Payer: Self-pay | Admitting: *Deleted

## 2016-05-05 NOTE — Telephone Encounter (Signed)
Pre-Visit Call completed with patient and chart updated.   Pre-Visit Info documented in Specialty Comments under Snap[employer]ot.    

## 2016-05-06 ENCOUNTER — Encounter: Payer: Self-pay | Admitting: Family Medicine

## 2016-05-06 ENCOUNTER — Ambulatory Visit (INDEPENDENT_AMBULATORY_CARE_PROVIDER_SITE_OTHER): Payer: BLUE CROSS/BLUE SHIELD | Admitting: Family Medicine

## 2016-05-06 VITALS — BP 98/62 | HR 96 | Temp 98.4°F | Ht 64.0 in | Wt 109.4 lb

## 2016-05-06 DIAGNOSIS — R42 Dizziness and giddiness: Secondary | ICD-10-CM

## 2016-05-06 DIAGNOSIS — Z13220 Encounter for screening for lipoid disorders: Secondary | ICD-10-CM

## 2016-05-06 DIAGNOSIS — Z130 Encounter for screening for diseases of the blood and blood-forming organs and certain disorders involving the immune mechanism: Secondary | ICD-10-CM | POA: Diagnosis not present

## 2016-05-06 DIAGNOSIS — R110 Nausea: Secondary | ICD-10-CM

## 2016-05-06 DIAGNOSIS — Z131 Encounter for screening for diabetes mellitus: Secondary | ICD-10-CM

## 2016-05-06 DIAGNOSIS — R198 Other specified symptoms and signs involving the digestive system and abdomen: Secondary | ICD-10-CM | POA: Diagnosis not present

## 2016-05-06 DIAGNOSIS — Z1329 Encounter for screening for other suspected endocrine disorder: Secondary | ICD-10-CM | POA: Diagnosis not present

## 2016-05-06 LAB — COMPREHENSIVE METABOLIC PANEL
ALK PHOS: 42 U/L (ref 39–117)
ALT: 10 U/L (ref 0–35)
AST: 12 U/L (ref 0–37)
Albumin: 4.1 g/dL (ref 3.5–5.2)
BUN: 12 mg/dL (ref 6–23)
CHLORIDE: 106 meq/L (ref 96–112)
CO2: 28 mEq/L (ref 19–32)
Calcium: 9.4 mg/dL (ref 8.4–10.5)
Creatinine, Ser: 0.48 mg/dL (ref 0.40–1.20)
GFR: 192.4 mL/min (ref >60.00)
GLUCOSE: 88 mg/dL (ref 70–99)
POTASSIUM: 4.3 meq/L (ref 3.5–5.1)
Sodium: 138 mEq/L (ref 135–145)
Total Bilirubin: 0.4 mg/dL (ref 0.2–1.2)
Total Protein: 7.1 g/dL (ref 6.0–8.3)

## 2016-05-06 LAB — CBC
HCT: 35.8 % — ABNORMAL LOW (ref 36.0–46.0)
Hemoglobin: 12.2 g/dL (ref 12.0–15.0)
MCHC: 34 g/dL (ref 30.0–36.0)
MCV: 85.6 fl (ref 78.0–100.0)
Platelets: 248 K/uL (ref 150.0–400.0)
RBC: 4.19 Mil/uL (ref 3.87–5.11)
RDW: 13.5 % (ref 11.5–15.5)
WBC: 7.1 K/uL (ref 4.0–10.5)

## 2016-05-06 LAB — LIPID PANEL
Cholesterol: 105 mg/dL (ref 0–200)
HDL: 39.8 mg/dL (ref >39.00)
LDL Cholesterol: 57 mg/dL (ref 0–99)
NonHDL: 65.13
Total CHOL/HDL Ratio: 3
Triglycerides: 39 mg/dL (ref 0.0–149.0)
VLDL: 7.8 mg/dL (ref 0.0–40.0)

## 2016-05-06 LAB — HEMOGLOBIN A1C: Hgb A1c MFr Bld: 5.5 % (ref 4.6–6.5)

## 2016-05-06 LAB — TSH: TSH: 1.25 u[IU]/mL (ref 0.35–4.50)

## 2016-05-06 MED ORDER — DICYCLOMINE HCL 20 MG PO TABS: ORAL_TABLET | ORAL | 3 refills | Status: DC

## 2016-05-06 NOTE — Progress Notes (Signed)
Pre visit review using our clinic review tool, if applicable. No additional management support is needed unless otherwise documented below in the visit note. 

## 2016-05-06 NOTE — Patient Instructions (Signed)
It was very nice to see you today- I will be in touch with your labs asap As long as your labs look normal, I would suggest that we try a medication for irritable bowel called bentyl. This is a medication to prevent spasms and pain, and can help with diarrhea.   If you start to have the dizziness and blurred vision any other times please let me know. However it if only occurs with computer use (and goes away after a break from the screen) I think it is due to eye fatigue.

## 2016-05-06 NOTE — Progress Notes (Addendum)
[facility] [address], [place], [place] [address] [phone]  Date:  05/06/2016   Name:  [name]   DOB:  07-23-1986   MRN:  [id number]  PCP:  Lamar Blinks, MD    Chief Complaint: Establish Care (c/o nausea that comes and goes over the past year. Pt states that she has noticed an increase in the frequency over the last few weeks. Also noticed dizziness and blurred vision in both eyes thats noticed after looking at the computer for a while at work. Last eye exam within the past 18 months. )   History of Present Illness:  [name] is a 30 y.o. very pleasant female patient who presents with the following:  Here today as a new patient to establish care and also to discuss some concerns.  She has not had a family doc in some time.  Her OBG is Dr. Garwin Brothers.  She is on OCP and takes it continuously. She did have a DVT back in 2007- she had pyelo and then got septic, ended up in the ICU.  This is when she had the clot- due to immobility.    She has 2 children ages 36 and 57 yo.  She works with [employer] [place] [employer].   She has noted that her stomach seems "irritatble," her BM pattern is abnormal.  She has had some sx of a nervous stomach in the past. As a child she notes that if she was afraid she might have diarrhea.  Now notes that she will have diarrhea, and then constipation.  She might have diarrhea that keeps her awake all night on occasion. No blood in her stool She may feel nauseated, but does not vomit. She does not have belly pain per se. She is lactose intolerant but avoids dairy. Otherwise she cannot determine any foods that tend to make her feel bad.   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 She does not have this any other time, and noted that it will resolve if she takes a break from the screen and walks around the office.   She notes that it tends to occur more if she is feeling frustrated or irritated at work  No recent BW that she can recall.    She is on allergy medications and takes allergy shots per Dr. Donneta Romberg.   LMP was in June- she is using continuous OCP per Dr. Lesle Chris advice  Patient Active Problem List   Diagnosis Date Noted  . Postpartum care following vaginal delivery (3/28) 12/24/2013  . Unstable lie of fetus 12/23/2013  . Pregnancy 12/21/2013  . SAB (spontaneous abortion)--02/26/13 02/26/2013  . Vaginal bleeding in pregnancy 02/24/2013  . Dysmenorrhea 02/13/2012  . ALLERGIC RHINITIS 09/03/2009  . ASTHMA 09/03/2009  . GASTROENTERITIS 09/03/2009  . HEADACHE 09/03/2009  . DEEP VENOUS THROMBOPHLEBITIS, LEG, RIGHT 07/27/2007  . ADULT RESPIRATORY DISTRESS SYNDROME 07/27/2007    Past Medical History:  Diagnosis Date  . Asthma   . Bilateral leg numbness 05/24/2007  . Bronchitis, asthmatic   . DVT (deep venous thrombosis), right   . FHx: cancer   . FHx: diabetes mellitus   . FHx: heart disease   . FHx: kidney disease   . FHx: migraine headaches   . FHx: stroke   . FHx: thyroid disease   . H/O acute respiratory distress  syndrome    With a history of septic shock  . H/O dysmenorrhea 2005  . H/O pyelonephritis   . H/O septic shock 05/2006  . H/O varicella   . Hepatitis    vaccine x3  . Hoarseness of voice    intermittent  . Hx: UTI (urinary tract infection)    x 1  . Low BMI 2007   During pregnancy  . Postpartum care following vaginal delivery (3/28) 12/24/2013  . Yeast infection     Past Surgical History:  Procedure Laterality Date  . WISDOM TOOTH EXTRACTION     x 4    Social History  Substance Use Topics  . Smoking status: Never Smoker  . Smokeless tobacco: Never Used  . Alcohol use No    Family History  Problem Relation Age of Onset  . Diabetes Maternal Grandmother   . Heart disease Maternal Grandmother     heart attack  . COPD Maternal Grandmother      bronchitis  . Hypertension Father   . Hyperlipidemia Father   . Diabetes Father   . Fibroids Mother     had hyst  . Hypertension Mother   . Hyperlipidemia Mother   . Diabetes Mother   . Fibroids Paternal Aunt   . Mental retardation Paternal Aunt   . Cancer Paternal Grandfather     brain tumor  . Hypertension Maternal Grandfather   . Stroke Maternal Grandfather   . Kidney disease Maternal Grandfather     dialysis    Allergies  Allergen Reactions  . Amoxicillin Hives    Childhood  . Lactose Intolerance (Gi)     Medication list has been reviewed and updated.  Current Outpatient Prescriptions on File Prior to Visit  Medication Sig Dispense Refill  . levocetirizine (XYZAL) 5 MG tablet Take 5 mg by mouth daily.    . montelukast (SINGULAIR) 10 MG tablet Take 10 mg by mouth daily.    . Multiple Vitamins-Minerals (MULTIVITAMIN ADULT PO) Take 1 tablet by mouth daily.    Cyndie Chime Estrad-Fe Biphas (LO LOESTRIN FE PO) Take 1 tablet by mouth daily.      No current facility-administered medications on file prior to visit.     Review of Systems:  As per HPI- otherwise negative.   Physical Examination: Vitals:   05/06/16 0832  BP: 98/62  Pulse: 96  Temp: 98.4 F (36.9 C)   Vitals:   05/06/16 0832  Weight: 109 lb 6.4 oz (49.6 kg)  Height: 5\' 4"  (1.626 m)   Body mass index is 18.78 kg/m. Ideal Body Weight: Weight in (lb) to have BMI = 25: 145.3  GEN: WDWN, NAD, Non-toxic, A & O x 3, slight build, looks well HEENT: Atraumatic, Normocephalic. Neck supple. No masses, No LAD.  Bilateral TM wnl, oropharynx normal.  PEERL,EOMI.   Ears and Nose: No external deformity. CV: RRR, No M/G/R. No JVD. No thrill. No extra heart sounds. PULM: CTA B, no wheezes, crackles, rhonchi. No retractions. No resp. distress. No accessory muscle use. ABD: S, NT, ND, +BS. No rebound. No HSM.  Post- pregnancy loose skin and a small, non tender umbilical hernia is present EXTR: No c/c/e NEURO  Normal gait.  PSYCH: Normally interactive. Conversant. Not depressed or anxious appearing.  Calm demeanor.   Orthostatic VS for the past 24 hrs:  BP- Lying Pulse- Lying BP- Sitting Pulse- Sitting BP- Standing at 0 minutes Pulse- Standing at 0 minutes  05/06/16 0848 102/63 86 97/63 84 100/75 97  Assessment and Plan: Alternating constipation and diarrhea - Plan: CBC, Comprehensive metabolic panel, TSH  Screening for deficiency anemia - Plan: CBC  Screening for diabetes mellitus - Plan: Comprehensive metabolic panel, Hemoglobin A1c  Screening for thyroid disorder - Plan: TSH  Nausea without vomiting  Screening for hyperlipidemia - Plan: Lipid panel  Dizziness and giddiness  Here today to establish care and discuss a couple of concerns She notes sx of likely IBS- she is lactose intolerant and does have a history of sensitive stomach as a child.  No family history of IBD. Will check labs, and try bentyl if all normal.  If this does not help plan to refer to GI Await other labs for her and will be in touch with her  It was very nice to see you today- I will be in touch with your labs asap As long as your labs look normal, I would suggest that we try a medication for irritable bowel called bentyl. This is a medication to prevent spasms and pain, and can help with diarrhea.   If you start to have the dizziness and blurred vision any other times please let me know. However it if only occurs with computer use (and goes away after a break from the screen) I think it is due to eye fatigue.   Signed Lamar Blinks, MD  Received her labs and sent message on mychart  Results for orders placed or performed in visit on 05/06/16  CBC  Result Value Ref Range   WBC 7.1 4.0 - 10.5 K/uL   RBC 4.19 3.87 - 5.11 Mil/uL   Platelets 248.0 150.0 - 400.0 K/uL   Hemoglobin 12.2 12.0 - 15.0 g/dL   HCT 35.8 (L) 36.0 - 46.0 %   MCV 85.6 78.0 - 100.0 fl   MCHC 34.0 30.0 - 36.0 g/dL   RDW 13.5 11.5 -  15.5 %  Comprehensive metabolic panel  Result Value Ref Range   Sodium 138 135 - 145 mEq/L   Potassium 4.3 3.5 - 5.1 mEq/L   Chloride 106 96 - 112 mEq/L   CO2 28 19 - 32 mEq/L   Glucose, Bld 88 70 - 99 mg/dL   BUN 12 6 - 23 mg/dL   Creatinine, Ser 0.48 0.40 - 1.20 mg/dL   Total Bilirubin 0.4 0.2 - 1.2 mg/dL   Alkaline Phosphatase 42 39 - 117 U/L   AST 12 0 - 37 U/L   ALT 10 0 - 35 U/L   Total Protein 7.1 6.0 - 8.3 g/dL   Albumin 4.1 3.5 - 5.2 g/dL   Calcium 9.4 8.4 - 10.5 mg/dL   GFR 192.40 >60.00 mL/min  Lipid panel  Result Value Ref Range   Cholesterol 105 0 - 200 mg/dL   Triglycerides 39.0 0.0 - 149.0 mg/dL   HDL 39.80 >39.00 mg/dL   VLDL 7.8 0.0 - 40.0 mg/dL   LDL Cholesterol 57 0 - 99 mg/dL   Total CHOL/HDL Ratio 3    NonHDL 65.13   TSH  Result Value Ref Range   TSH 1.25 0.35 - 4.50 uIU/mL  Hemoglobin A1c  Result Value Ref Range   Hgb A1c MFr Bld 5.5 4.6 - 6.5 %   Your labs all look great- cholesterol is very good!  I will send the IBS medication that we talked about to your drug store. Give this a try and please let me know how you do with the medication.  If it does not help you or if you  continue to have symptoms I will refer you to a GI doctor,  Let me know if any questions  Will rx bentyl for her to try

## 2016-05-06 NOTE — Addendum Note (Signed)
Addended by: Lamar Blinks C on: 05/06/2016 08:57 PM   Modules accepted: Orders

## 2016-05-08 DIAGNOSIS — J3081 Allergic rhinitis due to animal (cat) (dog) hair and dander: Secondary | ICD-10-CM | POA: Diagnosis not present

## 2016-05-08 DIAGNOSIS — J3089 Other allergic rhinitis: Secondary | ICD-10-CM | POA: Diagnosis not present

## 2016-05-08 DIAGNOSIS — J301 Allergic rhinitis due to pollen: Secondary | ICD-10-CM | POA: Diagnosis not present

## 2016-05-13 DIAGNOSIS — J3089 Other allergic rhinitis: Secondary | ICD-10-CM | POA: Diagnosis not present

## 2016-05-13 DIAGNOSIS — J301 Allergic rhinitis due to pollen: Secondary | ICD-10-CM | POA: Diagnosis not present

## 2016-05-13 DIAGNOSIS — J3081 Allergic rhinitis due to animal (cat) (dog) hair and dander: Secondary | ICD-10-CM | POA: Diagnosis not present

## 2016-05-20 DIAGNOSIS — J301 Allergic rhinitis due to pollen: Secondary | ICD-10-CM | POA: Diagnosis not present

## 2016-05-20 DIAGNOSIS — J3081 Allergic rhinitis due to animal (cat) (dog) hair and dander: Secondary | ICD-10-CM | POA: Diagnosis not present

## 2016-05-20 DIAGNOSIS — J3089 Other allergic rhinitis: Secondary | ICD-10-CM | POA: Diagnosis not present

## 2016-05-27 DIAGNOSIS — J3089 Other allergic rhinitis: Secondary | ICD-10-CM | POA: Diagnosis not present

## 2016-05-27 DIAGNOSIS — J301 Allergic rhinitis due to pollen: Secondary | ICD-10-CM | POA: Diagnosis not present

## 2016-05-27 DIAGNOSIS — J3081 Allergic rhinitis due to animal (cat) (dog) hair and dander: Secondary | ICD-10-CM | POA: Diagnosis not present

## 2016-06-15 DIAGNOSIS — J3081 Allergic rhinitis due to animal (cat) (dog) hair and dander: Secondary | ICD-10-CM | POA: Diagnosis not present

## 2016-06-15 DIAGNOSIS — J301 Allergic rhinitis due to pollen: Secondary | ICD-10-CM | POA: Diagnosis not present

## 2016-06-15 DIAGNOSIS — J3089 Other allergic rhinitis: Secondary | ICD-10-CM | POA: Diagnosis not present

## 2016-06-16 ENCOUNTER — Encounter: Payer: Self-pay | Admitting: Family Medicine

## 2016-06-19 DIAGNOSIS — J301 Allergic rhinitis due to pollen: Secondary | ICD-10-CM | POA: Diagnosis not present

## 2016-06-19 DIAGNOSIS — J3081 Allergic rhinitis due to animal (cat) (dog) hair and dander: Secondary | ICD-10-CM | POA: Diagnosis not present

## 2016-06-19 DIAGNOSIS — J3089 Other allergic rhinitis: Secondary | ICD-10-CM | POA: Diagnosis not present

## 2016-06-26 DIAGNOSIS — J3089 Other allergic rhinitis: Secondary | ICD-10-CM | POA: Diagnosis not present

## 2016-06-26 DIAGNOSIS — J301 Allergic rhinitis due to pollen: Secondary | ICD-10-CM | POA: Diagnosis not present

## 2016-06-26 DIAGNOSIS — J3081 Allergic rhinitis due to animal (cat) (dog) hair and dander: Secondary | ICD-10-CM | POA: Diagnosis not present

## 2016-07-10 DIAGNOSIS — J301 Allergic rhinitis due to pollen: Secondary | ICD-10-CM | POA: Diagnosis not present

## 2016-07-10 DIAGNOSIS — J3081 Allergic rhinitis due to animal (cat) (dog) hair and dander: Secondary | ICD-10-CM | POA: Diagnosis not present

## 2016-07-10 DIAGNOSIS — J3089 Other allergic rhinitis: Secondary | ICD-10-CM | POA: Diagnosis not present

## 2016-07-13 DIAGNOSIS — J3089 Other allergic rhinitis: Secondary | ICD-10-CM | POA: Diagnosis not present

## 2016-07-13 DIAGNOSIS — J3081 Allergic rhinitis due to animal (cat) (dog) hair and dander: Secondary | ICD-10-CM | POA: Diagnosis not present

## 2016-07-24 DIAGNOSIS — J3089 Other allergic rhinitis: Secondary | ICD-10-CM | POA: Diagnosis not present

## 2016-07-24 DIAGNOSIS — J3081 Allergic rhinitis due to animal (cat) (dog) hair and dander: Secondary | ICD-10-CM | POA: Diagnosis not present

## 2016-07-24 DIAGNOSIS — J301 Allergic rhinitis due to pollen: Secondary | ICD-10-CM | POA: Diagnosis not present

## 2016-07-31 DIAGNOSIS — J3089 Other allergic rhinitis: Secondary | ICD-10-CM | POA: Diagnosis not present

## 2016-07-31 DIAGNOSIS — J3081 Allergic rhinitis due to animal (cat) (dog) hair and dander: Secondary | ICD-10-CM | POA: Diagnosis not present

## 2016-07-31 DIAGNOSIS — J301 Allergic rhinitis due to pollen: Secondary | ICD-10-CM | POA: Diagnosis not present

## 2016-08-18 DIAGNOSIS — J3089 Other allergic rhinitis: Secondary | ICD-10-CM | POA: Diagnosis not present

## 2016-08-18 DIAGNOSIS — J3081 Allergic rhinitis due to animal (cat) (dog) hair and dander: Secondary | ICD-10-CM | POA: Diagnosis not present

## 2016-08-18 DIAGNOSIS — J301 Allergic rhinitis due to pollen: Secondary | ICD-10-CM | POA: Diagnosis not present

## 2016-09-02 DIAGNOSIS — J3089 Other allergic rhinitis: Secondary | ICD-10-CM | POA: Diagnosis not present

## 2016-09-02 DIAGNOSIS — J301 Allergic rhinitis due to pollen: Secondary | ICD-10-CM | POA: Diagnosis not present

## 2016-09-02 DIAGNOSIS — J3081 Allergic rhinitis due to animal (cat) (dog) hair and dander: Secondary | ICD-10-CM | POA: Diagnosis not present

## 2016-10-07 ENCOUNTER — Encounter: Payer: Self-pay | Admitting: Physician Assistant

## 2016-10-07 ENCOUNTER — Ambulatory Visit: Payer: BLUE CROSS/BLUE SHIELD | Admitting: Family Medicine

## 2016-10-07 ENCOUNTER — Ambulatory Visit (INDEPENDENT_AMBULATORY_CARE_PROVIDER_SITE_OTHER): Payer: BLUE CROSS/BLUE SHIELD | Admitting: Physician Assistant

## 2016-10-07 VITALS — BP 114/76 | HR 107 | Temp 98.5°F | Resp 16 | Ht 64.0 in | Wt 107.4 lb

## 2016-10-07 DIAGNOSIS — J039 Acute tonsillitis, unspecified: Secondary | ICD-10-CM

## 2016-10-07 DIAGNOSIS — J0100 Acute maxillary sinusitis, unspecified: Secondary | ICD-10-CM | POA: Diagnosis not present

## 2016-10-07 MED ORDER — AZITHROMYCIN 250 MG PO TABS
ORAL_TABLET | ORAL | 0 refills | Status: DC
Start: 2016-10-07 — End: 2017-01-15

## 2016-10-07 NOTE — Patient Instructions (Signed)
Take oral antibiotic as discussed. Alternate tylenol and ibuprofen. Follow-up soon with your dentist. Let us know if your symptoms do not improve.  Tonsillitis Tonsillitis is an infection of the throat that causes the tonsils to become red, tender, and swollen. Tonsils are collections of lymphoid tissue at the back of the throat. Each tonsil has crevices (crypts). Tonsils help fight nose and throat infections and keep infection from spreading to other parts of the body for the first 18 months of life. What are the causes? Sudden (acute) tonsillitis is usually caused by infection with streptococcal bacteria. Long-lasting (chronic) tonsillitis occurs when the crypts of the tonsils become filled with pieces of food and bacteria, which makes it easy for the tonsils to become repeatedly infected. What are the signs or symptoms? Symptoms of tonsillitis include:  A sore throat, with possible difficulty swallowing.  White patches on the tonsils.  Fever.  Tiredness.  New episodes of snoring during sleep, when you did not snore before.  Small, foul-smelling, yellowish-white pieces of material (tonsilloliths) that you occasionally cough up or spit out. The tonsilloliths can also cause you to have bad breath. How is this diagnosed? Tonsillitis can be diagnosed through a physical exam. Diagnosis can be confirmed with the results of lab tests, including a throat culture. How is this treated? The goals of tonsillitis treatment include the reduction of the severity and duration of symptoms and prevention of associated conditions. Symptoms of tonsillitis can be improved with the use of steroids to reduce the swelling. Tonsillitis caused by bacteria can be treated with antibiotic medicines. Usually, treatment with antibiotic medicines is started before the cause of the tonsillitis is known. However, if it is determined that the cause is not bacterial, antibiotic medicines will not treat the tonsillitis. If  attacks of tonsillitis are severe and frequent, your health care provider may recommend surgery to remove the tonsils (tonsillectomy). Follow these instructions at home:  Rest as much as possible and get plenty of sleep.  Drink plenty of fluids. While the throat is very sore, eat soft foods or liquids, such as sherbet, soups, or instant breakfast drinks.  Eat frozen ice pops.  Gargle with a warm or cold liquid to help soothe the throat. Mix 1/4 teaspoon of salt and 1/4 teaspoon of baking soda in 8 oz of water. Contact a health care provider if:  Large, tender lumps develop in your neck.  A rash develops.  A green, yellow-brown, or bloody substance is coughed up.  You are unable to swallow liquids or food for 24 hours.  You notice that only one of the tonsils is swollen. Get help right away if:  You develop any new symptoms such as vomiting, severe headache, stiff neck, chest pain, or trouble breathing or swallowing.  You have severe throat pain along with drooling or voice changes.  You have severe pain, unrelieved with recommended medications.  You are unable to fully open the mouth.  You develop redness, swelling, or severe pain anywhere in the neck.  You have a fever. This information is not intended to replace advice given to you by your health care provider. Make sure you discuss any questions you have with your health care provider. Document Released: 06/24/2005 Document Revised: 02/20/2016 Document Reviewed: 03/03/2013 Elsevier Interactive Patient Education  2017 [employer].

## 2016-10-07 NOTE — Progress Notes (Signed)
Subjective:    Patient ID: [name], female    DOB: 10-27-1985, 31 y.o.   MRN: [id number]  HPI  [name] is a 31 y/o female who presents with three day history of facial pain, swelling and tenderness to R maxillary sinus. Took one Sudafed last night and one ibuprofen. She had improvement with ibuprofen. She has had occasional cough, always dry. History of asthma, taking medicines as prescribed, no complaints of SOB. No fever. Reports sore throat a few weeks ago, but it resolved. Daughter, who is 2, is starting to get cold, patient denies any other sick contacts. Appetite is variable due to facial pain. Getting adequate water. Received a flu shot this year.  She said she is due to see the dentist for a filling, denies any specific tooth pain at this time. Does have a history of dental infections presenting this way.  Review of Systems  See HPI  Past Medical History:  Diagnosis Date  . Asthma   . Bilateral leg numbness 05/24/2007  . Bronchitis, asthmatic   . DVT (deep venous thrombosis), right   . FHx: cancer   . FHx: diabetes mellitus   . FHx: heart disease   . FHx: kidney disease   . FHx: migraine headaches   . FHx: stroke   . FHx: thyroid disease   . H/O acute respiratory distress syndrome    With a history of septic shock  . H/O dysmenorrhea 2005  . H/O pyelonephritis   . H/O septic shock 05/2006  . H/O varicella   . Hepatitis    vaccine x3  . Hoarseness of voice    intermittent  . Hx: UTI (urinary tract infection)    x 1  . Low BMI 2007   During pregnancy  . Postpartum care following vaginal delivery (3/28) 12/24/2013  . Yeast infection      Social History   Social History  . Marital status: Married    Spouse name: N/A  . Number of children: N/A  . Years of education: N/A   Occupational History  . Not on file.   Social History Main Topics  . Smoking status: Never Smoker  . Smokeless tobacco: Never Used  . Alcohol use No  . Drug use: No  . Sexual  activity: Yes    Birth control/ protection: None   Other Topics Concern  . Not on file   Social History Narrative  . No narrative on file    Past Surgical History:  Procedure Laterality Date  . WISDOM TOOTH EXTRACTION     x 4    Family History  Problem Relation Age of Onset  . Diabetes Maternal Grandmother   . Heart disease Maternal Grandmother     heart attack  . COPD Maternal Grandmother     bronchitis  . Hypertension Father   . Hyperlipidemia Father   . Diabetes Father   . Fibroids Mother     had hyst  . Hypertension Mother   . Hyperlipidemia Mother   . Diabetes Mother   . Fibroids Paternal Aunt   . Mental retardation Paternal Aunt   . Cancer Paternal Grandfather     brain tumor  . Hypertension Maternal Grandfather   . Stroke Maternal Grandfather   . Kidney disease Maternal Grandfather     dialysis    Allergies  Allergen Reactions  . Amoxicillin Hives    Childhood  . Lactose Intolerance (Gi)     Current Outpatient Prescriptions on File Prior  to Visit  Medication Sig Dispense Refill  . levocetirizine (XYZAL) 5 MG tablet Take 5 mg by mouth daily.    . montelukast (SINGULAIR) 10 MG tablet Take 10 mg by mouth daily.     No current facility-administered medications on file prior to visit.     BP 114/76 (BP Location: Right Arm, Cuff Size: Normal)   Pulse (!) 107   Temp 98.5 F (36.9 C) (Oral)   Resp 16   Ht 5\' 4"  (1.626 m)   Wt 107 lb 6.4 oz (48.7 kg)   SpO2 100%   BMI 18.44 kg/m       Objective:   Physical Exam  Constitutional: Vital signs are normal. She appears well-developed and well-nourished.  HENT:  Head: Normocephalic and atraumatic.  Right Ear: Tympanic membrane, external ear and ear canal normal. Tympanic membrane is not erythematous, not retracted and not bulging.  Left Ear: Tympanic membrane, external ear and ear canal normal. Tympanic membrane is not erythematous and not bulging.  Nose: Right sinus exhibits maxillary sinus  tenderness. Left sinus exhibits maxillary sinus tenderness.  Mouth/Throat: Uvula is midline and mucous membranes are normal. Oropharyngeal exudate, posterior oropharyngeal edema and posterior oropharyngeal erythema present.  Significant maxillary tenderness bilaterally  L tonsil 2+ with exudates, R tonsil 1+  No obvious signs of dental infection, no tenderness or erythema along gum line  Neck: Trachea normal.  Cardiovascular: Normal rate, regular rhythm and normal heart sounds.   Pulmonary/Chest: Effort normal and breath sounds normal.  No crackles or wheezing  Lymphadenopathy:    She has cervical adenopathy.  Bilateral cervical lymphadenopathy  Nursing note and vitals reviewed.     Assessment & Plan:  Exudative tonsillitis and acute non-recurrent maxillary sinusitis Will cover for bacterial etiology for tonsil and sinus with Azithromycin. She is PCN allergic. Discussed that if she begins to have tooth pain or noticeable gum tenderness, I recommend she follow up with her dentist ASAP to have her filling put in. She verbalized understanding. Recommend alternating tylenol and ibuprofen for anti-inflammatory relief and to help with pain.   Inda Coke PA-C 10/07/16

## 2016-10-07 NOTE — Progress Notes (Signed)
Pre visit review using our clinic review tool, if applicable. No additional management support is needed unless otherwise documented below in the visit note. 

## 2016-10-19 DIAGNOSIS — J019 Acute sinusitis, unspecified: Secondary | ICD-10-CM | POA: Diagnosis not present

## 2016-10-19 DIAGNOSIS — R05 Cough: Secondary | ICD-10-CM | POA: Diagnosis not present

## 2016-10-20 ENCOUNTER — Ambulatory Visit: Payer: BLUE CROSS/BLUE SHIELD | Admitting: Internal Medicine

## 2016-12-11 DIAGNOSIS — J301 Allergic rhinitis due to pollen: Secondary | ICD-10-CM | POA: Diagnosis not present

## 2016-12-14 DIAGNOSIS — J3089 Other allergic rhinitis: Secondary | ICD-10-CM | POA: Diagnosis not present

## 2016-12-14 DIAGNOSIS — J3081 Allergic rhinitis due to animal (cat) (dog) hair and dander: Secondary | ICD-10-CM | POA: Diagnosis not present

## 2017-01-15 ENCOUNTER — Ambulatory Visit (HOSPITAL_BASED_OUTPATIENT_CLINIC_OR_DEPARTMENT_OTHER)
Admission: RE | Admit: 2017-01-15 | Discharge: 2017-01-15 | Disposition: A | Discharge status: Home / Self Care | Payer: BLUE CROSS/BLUE SHIELD | Source: Ambulatory Visit | Attending: Family Medicine | Admitting: Family Medicine

## 2017-01-15 ENCOUNTER — Encounter: Payer: Self-pay | Admitting: Family Medicine

## 2017-01-15 ENCOUNTER — Ambulatory Visit (INDEPENDENT_AMBULATORY_CARE_PROVIDER_SITE_OTHER): Payer: BLUE CROSS/BLUE SHIELD | Admitting: Family Medicine

## 2017-01-15 ENCOUNTER — Other Ambulatory Visit (HOSPITAL_COMMUNITY)
Admission: RE | Admit: 2017-01-15 | Discharge: 2017-01-15 | Disposition: A | Discharge status: Home / Self Care | Payer: BLUE CROSS/BLUE SHIELD | Source: Ambulatory Visit | Attending: Family Medicine | Admitting: Family Medicine

## 2017-01-15 VITALS — BP 90/50 | HR 96 | Temp 98.2°F | Ht 64.0 in | Wt 106.2 lb

## 2017-01-15 DIAGNOSIS — Z975 Presence of (intrauterine) contraceptive device: Secondary | ICD-10-CM | POA: Insufficient documentation

## 2017-01-15 DIAGNOSIS — R1031 Right lower quadrant pain: Secondary | ICD-10-CM | POA: Insufficient documentation

## 2017-01-15 DIAGNOSIS — N3001 Acute cystitis with hematuria: Secondary | ICD-10-CM | POA: Diagnosis not present

## 2017-01-15 LAB — POC URINALSYSI DIPSTICK (AUTOMATED)
BILIRUBIN UA: NEGATIVE
GLUCOSE UA: NEGATIVE
Ketones, UA: NEGATIVE
Nitrite, UA: POSITIVE
Urobilinogen, UA: 0.2 E.U./dL
pH, UA: 6 (ref 5.0–8.0)

## 2017-01-15 MED ORDER — SULFAMETHOXAZOLE-TRIMETHOPRIM 800-160 MG PO TABS: 1.0000 | ORAL_TABLET | Freq: Two times a day (BID) | ORAL | 0 refills | Status: DC

## 2017-01-15 NOTE — Patient Instructions (Addendum)
Try to keep a symptom journal/diary to see what factors (food, bowel movements, etc) affect your pain.  Drink 32 oz of water and don't pee after 11 AM.  Give [place] 2-3 business days to get the results of your ultrasound back to you.

## 2017-01-15 NOTE — Progress Notes (Signed)
Chief Complaint  Patient presents with  . Abdominal Pain    (R) side-pt states it feels like a pulling in her stomach, and burning while urinating first thing in the am-started on last Sat night, along with diarrhea on yest    [name] is here for abdominal pain.  Duration: 6 days Nighttime awakenings? No Bleeding? No Weight loss? No Palliation: None Provocation: None Associated symptoms: some nausea, burning with urination Denies: fever, vag discharge, new sex partners and vomiting Treatment to date: Bentyl  ROS: Constitutional: No fevers GI: No bleeding +RLQ pain  Past Medical History:  Diagnosis Date  . Asthma   . Bilateral leg numbness 05/24/2007  . Bronchitis, asthmatic   . DVT (deep venous thrombosis), right   . FHx: cancer   . FHx: diabetes mellitus   . FHx: heart disease   . FHx: kidney disease   . FHx: migraine headaches   . FHx: stroke   . FHx: thyroid disease   . H/O acute respiratory distress syndrome    With a history of septic shock  . H/O dysmenorrhea 2005  . H/O pyelonephritis   . H/O septic shock 05/2006  . H/O varicella   . Hepatitis    vaccine x3  . Hoarseness of voice    intermittent  . Hx: UTI (urinary tract infection)    x 1  . Low BMI 2007   During pregnancy  . Postpartum care following vaginal delivery (3/28) 12/24/2013  . Yeast infection    Family History  Problem Relation Age of Onset  . Diabetes Maternal Grandmother   . Heart disease Maternal Grandmother     heart attack  . COPD Maternal Grandmother     bronchitis  . Hypertension Father   . Hyperlipidemia Father   . Diabetes Father   . Fibroids Mother     had hyst  . Hypertension Mother   . Hyperlipidemia Mother   . Diabetes Mother   . Fibroids Paternal Aunt   . Mental retardation Paternal Aunt   . Cancer Paternal Grandfather     brain tumor  . Hypertension Maternal Grandfather   . Stroke Maternal Grandfather   . Kidney disease Maternal Grandfather     dialysis    Past Surgical History:  Procedure Laterality Date  . WISDOM TOOTH EXTRACTION     x 4    BP (!) 90/50 (BP Location: Left Arm, Patient Position: Sitting, Cuff Size: Normal)   Pulse 96   Temp 98.2 F (36.8 C) (Oral)   Ht 5\' 4"  (1.626 m)   Wt 106 lb 3.2 oz (48.2 kg)   LMP 12/18/2016 (Approximate)   SpO2 99%   BMI 18.23 kg/m  Gen.: Awake, alert, appears stated age 31: Mucous membranes moist without mucosal lesions Heart: Regular rate and rhythm without murmurs Lungs: Clear auscultation bilaterally, no rales or wheezing, normal effort without accessory muscle use. Abdomen: Bowel sounds are present. Abdomen is soft, Mild TTP in RLQ, nondistended, no masses or organomegaly. Negative Murphy's, Rovsing's, McBurney's, and Carnett's sign. Psych: Age appropriate judgment and insight. Normal mood and affect.  Right lower quadrant pain - Plan: US Transvaginal Non-OB, US Pelvis Complete, POCT Urinalysis Dipstick (Automated), Urine cytology ancillary only, Urine Culture  Acute cystitis with hematuria - Plan: POCT Urinalysis Dipstick (Automated), Urine cytology ancillary only, Urine Culture, sulfamethoxazole-trimethoprim (BACTRIM DS,SEPTRA DS) 800-160 MG tablet  Orders as above. [place] to r/o ovarian pathosis if not related to UTI. Bactrim. Stay well hydrated. F/u pending above. Pt  voiced understanding and agreement to the plan.  [place], DO 01/15/17 10:43 AM

## 2017-01-15 NOTE — Progress Notes (Signed)
Pre visit review using our clinic review tool, if applicable. No additional management support is needed unless otherwise documented below in the visit note. 

## 2017-01-17 LAB — URINE CULTURE

## 2017-01-18 LAB — URINE CYTOLOGY ANCILLARY ONLY
Chlamydia: NEGATIVE
Neisseria Gonorrhea: NEGATIVE
Trichomonas: NEGATIVE

## 2017-01-29 DIAGNOSIS — J3089 Other allergic rhinitis: Secondary | ICD-10-CM | POA: Diagnosis not present

## 2017-01-29 DIAGNOSIS — J3081 Allergic rhinitis due to animal (cat) (dog) hair and dander: Secondary | ICD-10-CM | POA: Diagnosis not present

## 2017-01-29 DIAGNOSIS — J4520 Mild intermittent asthma, uncomplicated: Secondary | ICD-10-CM | POA: Diagnosis not present

## 2017-01-29 DIAGNOSIS — J301 Allergic rhinitis due to pollen: Secondary | ICD-10-CM | POA: Diagnosis not present

## 2017-02-02 DIAGNOSIS — J3081 Allergic rhinitis due to animal (cat) (dog) hair and dander: Secondary | ICD-10-CM | POA: Diagnosis not present

## 2017-02-02 DIAGNOSIS — J301 Allergic rhinitis due to pollen: Secondary | ICD-10-CM | POA: Diagnosis not present

## 2017-02-02 DIAGNOSIS — J3089 Other allergic rhinitis: Secondary | ICD-10-CM | POA: Diagnosis not present

## 2017-02-04 ENCOUNTER — Encounter: Payer: Self-pay | Admitting: Family Medicine

## 2017-05-19 DIAGNOSIS — Z1151 Encounter for screening for human papillomavirus (HPV): Secondary | ICD-10-CM | POA: Diagnosis not present

## 2017-05-19 DIAGNOSIS — Z01419 Encounter for gynecological examination (general) (routine) without abnormal findings: Secondary | ICD-10-CM | POA: Diagnosis not present

## 2017-05-19 DIAGNOSIS — Z681 Body mass index (BMI) 19 or less, adult: Secondary | ICD-10-CM | POA: Diagnosis not present

## 2017-06-22 DIAGNOSIS — H6502 Acute serous otitis media, left ear: Secondary | ICD-10-CM | POA: Diagnosis not present

## 2017-06-22 DIAGNOSIS — R0981 Nasal congestion: Secondary | ICD-10-CM | POA: Diagnosis not present

## 2017-06-22 DIAGNOSIS — R05 Cough: Secondary | ICD-10-CM | POA: Diagnosis not present

## 2017-06-23 ENCOUNTER — Ambulatory Visit: Payer: BLUE CROSS/BLUE SHIELD | Admitting: Family Medicine

## 2017-07-07 DIAGNOSIS — N946 Dysmenorrhea, unspecified: Secondary | ICD-10-CM | POA: Diagnosis not present

## 2017-08-10 ENCOUNTER — Encounter: Payer: Self-pay | Admitting: Family Medicine

## 2017-08-10 ENCOUNTER — Other Ambulatory Visit (HOSPITAL_COMMUNITY)
Admission: RE | Admit: 2017-08-10 | Discharge: 2017-08-10 | Disposition: A | Discharge status: Home / Self Care | Payer: BLUE CROSS/BLUE SHIELD | Source: Ambulatory Visit | Attending: Family Medicine | Admitting: Family Medicine

## 2017-08-10 ENCOUNTER — Ambulatory Visit: Payer: BLUE CROSS/BLUE SHIELD | Admitting: Family Medicine

## 2017-08-10 VITALS — BP 95/64 | HR 98 | Temp 98.4°F | Resp 16 | Ht 64.0 in | Wt 111.0 lb

## 2017-08-10 DIAGNOSIS — N898 Other specified noninflammatory disorders of vagina: Secondary | ICD-10-CM | POA: Diagnosis not present

## 2017-08-10 DIAGNOSIS — R350 Frequency of micturition: Secondary | ICD-10-CM

## 2017-08-10 LAB — POC URINALSYSI DIPSTICK (AUTOMATED)
BILIRUBIN UA: NEGATIVE
GLUCOSE UA: NEGATIVE
KETONES UA: NEGATIVE
LEUKOCYTES UA: NEGATIVE
Nitrite, UA: NEGATIVE
Protein, UA: NEGATIVE
Urobilinogen, UA: 0.2 E.U./dL
pH, UA: 6 (ref 5.0–8.0)

## 2017-08-10 MED ORDER — CIPROFLOXACIN HCL 250 MG PO TABS: 250.0000 mg | ORAL_TABLET | Freq: Two times a day (BID) | ORAL | 0 refills | Status: DC

## 2017-08-10 NOTE — Patient Instructions (Signed)

## 2017-08-10 NOTE — Progress Notes (Signed)
Patient ID: [name], female   DOB: 1985/12/23, 31 y.o.   MRN: [id number]     Subjective:  I acted as a Education administrator for Dr. Carollee Herter.  Guerry Bruin, [place]   Patient ID: [name], female    DOB: 1986/07/17, 31 y.o.   MRN: [id number]  Chief Complaint  Patient presents with  . bladder discomfort    HPI  Patient is in today for bladder discomfort.  Started Sunday night.  She has tingling when she urinates but no pain.  + urinary frequency ---- no burning with urination -- only when bladder is full.   Patient Care Team: Copland, Gay Filler, MD as PCP - General (Family Medicine) Servando Salina, MD as Consulting Physician (Obstetrics and Gynecology) Mosetta Anis, MD as Referring Physician (Allergy)   Past Medical History:  Diagnosis Date  . Asthma   . Bilateral leg numbness 05/24/2007  . Bronchitis, asthmatic   . DVT (deep venous thrombosis), right   . FHx: cancer   . FHx: diabetes mellitus   . FHx: heart disease   . FHx: kidney disease   . FHx: migraine headaches   . FHx: stroke   . FHx: thyroid disease   . H/O acute respiratory distress syndrome    With a history of septic shock  . H/O dysmenorrhea 2005  . H/O pyelonephritis   . H/O septic shock 05/2006  . H/O varicella   . Hepatitis    vaccine x3  . Hoarseness of voice    intermittent  . Hx: UTI (urinary tract infection)    x 1  . Low BMI 2007   During pregnancy  . Postpartum care following vaginal delivery (3/28) 12/24/2013  . Yeast infection     Past Surgical History:  Procedure Laterality Date  . WISDOM TOOTH EXTRACTION     x 4    Family History  Problem Relation Age of Onset  . Diabetes Maternal Grandmother   . Heart disease Maternal Grandmother        heart attack  . COPD Maternal Grandmother        bronchitis  . Hypertension Father   . Hyperlipidemia Father   . Diabetes Father   . Fibroids Mother        had hyst  . Hypertension Mother   . Hyperlipidemia Mother   . Diabetes Mother   .  Fibroids Paternal Aunt   . Mental retardation Paternal Aunt   . Cancer Paternal Grandfather        brain tumor  . Hypertension Maternal Grandfather   . Stroke Maternal Grandfather   . Kidney disease Maternal Grandfather        dialysis    Social History   Socioeconomic History  . Marital status: Married    Spouse name: Not on file  . Number of children: Not on file  . Years of education: Not on file  . Highest education level: Not on file  Social Needs  . Financial resource strain: Not on file  . Food insecurity - worry: Not on file  . Food insecurity - inability: Not on file  . Transportation needs - medical: Not on file  . Transportation needs - non-medical: Not on file  Occupational History  . Not on file  Tobacco Use  . Smoking status: Never Smoker  . Smokeless tobacco: Never Used  Substance and Sexual Activity  . Alcohol use: No  . Drug use: No  . Sexual activity: Yes    Birth control/protection: None  Other Topics Concern  . Not on file  Social History Narrative  . Not on file    Outpatient Medications Prior to Visit  Medication Sig Dispense Refill  . levocetirizine (XYZAL) 5 MG tablet Take 5 mg by mouth daily.    Marland Kitchen levonorgestrel (MIRENA) 20 MCG/24HR IUD 1 each by Intrauterine route once.    . montelukast (SINGULAIR) 10 MG tablet Take 10 mg by mouth daily.    Marland Kitchen sulfamethoxazole-trimethoprim (BACTRIM DS,SEPTRA DS) 800-160 MG tablet Take 1 tablet by mouth 2 (two) times daily. 6 tablet 0   No facility-administered medications prior to visit.     Allergies  Allergen Reactions  . Amoxicillin Hives    Childhood  . Lactose Intolerance (Gi)     Review of Systems  Constitutional: Negative for fever and malaise/fatigue.  HENT: Negative for congestion.   Eyes: Negative for blurred vision.  Respiratory: Negative for cough and shortness of breath.   Cardiovascular: Negative for chest pain, palpitations and leg swelling.  Gastrointestinal: Negative for  vomiting.  Genitourinary: Positive for frequency. Negative for dysuria.  Musculoskeletal: Negative for back pain.  Skin: Negative for rash.  Neurological: Negative for loss of consciousness and headaches.       Objective:    Physical Exam  Constitutional: She appears well-developed and well-nourished. No distress.  Abdominal: Soft. She exhibits no distension. There is no tenderness. There is no rebound and no guarding.  Skin: She is not diaphoretic.  Psychiatric: She has a normal mood and affect. Her behavior is normal. Judgment and thought content normal.  Nursing note and vitals reviewed.   BP 95/64 (BP Location: Left Arm, Cuff Size: Normal)   Pulse 98   Temp 98.4 F (36.9 C) (Oral)   Resp 16   Ht 5\' 4"  (1.626 m)   Wt 111 lb (50.3 kg)   SpO2 99%   BMI 19.05 kg/m  Wt Readings from Last 3 Encounters:  08/10/17 111 lb (50.3 kg)  01/15/17 106 lb 3.2 oz (48.2 kg)  10/07/16 107 lb 6.4 oz (48.7 kg)   BP Readings from Last 3 Encounters:  08/10/17 95/64  01/15/17 (!) 90/50  10/07/16 114/76     Immunization History  Administered Date(s) Administered  . Influenza-Unspecified 05/30/2013, 07/15/2016  . Rho (D) Immune Globulin 02/24/2013, 12/24/2013  . Tdap 10/18/2013    Health Maintenance  Topic Date Due  . PAP SMEAR  02/27/2019  . TETANUS/TDAP  10/19/2023  . INFLUENZA VACCINE  Completed  . HIV Screening  Completed    Lab Results  Component Value Date   WBC 7.1 05/06/2016   HGB 12.2 05/06/2016   HCT 35.8 (L) 05/06/2016   PLT 248.0 05/06/2016   GLUCOSE 88 05/06/2016   CHOL 105 05/06/2016   TRIG 39.0 05/06/2016   HDL 39.80 05/06/2016   LDLCALC 57 05/06/2016   ALT 10 05/06/2016   AST 12 05/06/2016   NA 138 05/06/2016   K 4.3 05/06/2016   CL 106 05/06/2016   CREATININE 0.48 05/06/2016   BUN 12 05/06/2016   CO2 28 05/06/2016   TSH 1.25 05/06/2016   HGBA1C 5.5 05/06/2016    Lab Results  Component Value Date   TSH 1.25 05/06/2016   Lab Results  Component  Value Date   WBC 7.1 05/06/2016   HGB 12.2 05/06/2016   HCT 35.8 (L) 05/06/2016   MCV 85.6 05/06/2016   PLT 248.0 05/06/2016   Lab Results  Component Value Date   NA 138 05/06/2016   K  4.3 05/06/2016   CO2 28 05/06/2016   GLUCOSE 88 05/06/2016   BUN 12 05/06/2016   CREATININE 0.48 05/06/2016   BILITOT 0.4 05/06/2016   ALKPHOS 42 05/06/2016   AST 12 05/06/2016   ALT 10 05/06/2016   PROT 7.1 05/06/2016   ALBUMIN 4.1 05/06/2016   CALCIUM 9.4 05/06/2016   GFR 192.40 05/06/2016   Lab Results  Component Value Date   CHOL 105 05/06/2016   Lab Results  Component Value Date   HDL 39.80 05/06/2016   Lab Results  Component Value Date   LDLCALC 57 05/06/2016   Lab Results  Component Value Date   TRIG 39.0 05/06/2016   Lab Results  Component Value Date   CHOLHDL 3 05/06/2016   Lab Results  Component Value Date   HGBA1C 5.5 05/06/2016         Assessment & Plan:   Problem List Items Addressed This Visit    None    Visit Diagnoses    Frequency of urination    -  Primary   Relevant Medications   ciprofloxacin (CIPRO) 250 MG tablet   Other Relevant Orders   POCT Urinalysis Dipstick (Automated) (Completed)   Urine Culture   Vaginal discharge       Relevant Orders   Urine cytology ancillary only      I have discontinued Andreyah S. Buboltz's sulfamethoxazole-trimethoprim. I am also having her start on ciprofloxacin. Additionally, I am having her maintain her montelukast, levocetirizine, and levonorgestrel.  Meds ordered this encounter  Medications  . ciprofloxacin (CIPRO) 250 MG tablet    Sig: Take 1 tablet (250 mg total) 2 (two) times daily by mouth.    Dispense:  6 tablet    Refill:  0    CMA served as scribe during this visit. History, Physical and Plan performed by medical provider. Documentation and orders reviewed and attested to.  Ann Held, DO

## 2017-08-12 LAB — URINE CYTOLOGY ANCILLARY ONLY
Chlamydia: NEGATIVE
Neisseria Gonorrhea: NEGATIVE
TRICH (WINDOWPATH): NEGATIVE

## 2017-08-13 LAB — URINE CYTOLOGY ANCILLARY ONLY
Bacterial vaginitis: NEGATIVE
CANDIDA VAGINITIS: NEGATIVE

## 2017-08-23 DIAGNOSIS — J4520 Mild intermittent asthma, uncomplicated: Secondary | ICD-10-CM | POA: Diagnosis not present

## 2017-08-23 DIAGNOSIS — J301 Allergic rhinitis due to pollen: Secondary | ICD-10-CM | POA: Diagnosis not present

## 2017-08-23 DIAGNOSIS — J3081 Allergic rhinitis due to animal (cat) (dog) hair and dander: Secondary | ICD-10-CM | POA: Diagnosis not present

## 2017-08-23 DIAGNOSIS — J3089 Other allergic rhinitis: Secondary | ICD-10-CM | POA: Diagnosis not present

## 2017-08-25 DIAGNOSIS — J301 Allergic rhinitis due to pollen: Secondary | ICD-10-CM | POA: Diagnosis not present

## 2017-08-26 DIAGNOSIS — J3089 Other allergic rhinitis: Secondary | ICD-10-CM | POA: Diagnosis not present

## 2017-08-26 DIAGNOSIS — J3081 Allergic rhinitis due to animal (cat) (dog) hair and dander: Secondary | ICD-10-CM | POA: Diagnosis not present

## 2017-12-29 DIAGNOSIS — J3081 Allergic rhinitis due to animal (cat) (dog) hair and dander: Secondary | ICD-10-CM | POA: Diagnosis not present

## 2017-12-29 DIAGNOSIS — J3089 Other allergic rhinitis: Secondary | ICD-10-CM | POA: Diagnosis not present

## 2017-12-29 DIAGNOSIS — J301 Allergic rhinitis due to pollen: Secondary | ICD-10-CM | POA: Diagnosis not present

## 2018-01-07 DIAGNOSIS — J3089 Other allergic rhinitis: Secondary | ICD-10-CM | POA: Diagnosis not present

## 2018-01-07 DIAGNOSIS — J301 Allergic rhinitis due to pollen: Secondary | ICD-10-CM | POA: Diagnosis not present

## 2018-01-07 DIAGNOSIS — J3081 Allergic rhinitis due to animal (cat) (dog) hair and dander: Secondary | ICD-10-CM | POA: Diagnosis not present

## 2018-01-21 DIAGNOSIS — J301 Allergic rhinitis due to pollen: Secondary | ICD-10-CM | POA: Diagnosis not present

## 2018-01-21 DIAGNOSIS — J3089 Other allergic rhinitis: Secondary | ICD-10-CM | POA: Diagnosis not present

## 2018-01-21 DIAGNOSIS — J3081 Allergic rhinitis due to animal (cat) (dog) hair and dander: Secondary | ICD-10-CM | POA: Diagnosis not present

## 2018-02-02 DIAGNOSIS — J301 Allergic rhinitis due to pollen: Secondary | ICD-10-CM | POA: Diagnosis not present

## 2018-02-02 DIAGNOSIS — J3081 Allergic rhinitis due to animal (cat) (dog) hair and dander: Secondary | ICD-10-CM | POA: Diagnosis not present

## 2018-02-02 DIAGNOSIS — J3089 Other allergic rhinitis: Secondary | ICD-10-CM | POA: Diagnosis not present

## 2018-02-09 DIAGNOSIS — J301 Allergic rhinitis due to pollen: Secondary | ICD-10-CM | POA: Diagnosis not present

## 2018-02-09 DIAGNOSIS — J3081 Allergic rhinitis due to animal (cat) (dog) hair and dander: Secondary | ICD-10-CM | POA: Diagnosis not present

## 2018-02-09 DIAGNOSIS — J3089 Other allergic rhinitis: Secondary | ICD-10-CM | POA: Diagnosis not present

## 2018-03-02 DIAGNOSIS — J3089 Other allergic rhinitis: Secondary | ICD-10-CM | POA: Diagnosis not present

## 2018-03-02 DIAGNOSIS — J3081 Allergic rhinitis due to animal (cat) (dog) hair and dander: Secondary | ICD-10-CM | POA: Diagnosis not present

## 2018-03-02 DIAGNOSIS — J301 Allergic rhinitis due to pollen: Secondary | ICD-10-CM | POA: Diagnosis not present

## 2018-03-16 DIAGNOSIS — J3081 Allergic rhinitis due to animal (cat) (dog) hair and dander: Secondary | ICD-10-CM | POA: Diagnosis not present

## 2018-03-16 DIAGNOSIS — J301 Allergic rhinitis due to pollen: Secondary | ICD-10-CM | POA: Diagnosis not present

## 2018-03-16 DIAGNOSIS — J3089 Other allergic rhinitis: Secondary | ICD-10-CM | POA: Diagnosis not present

## 2018-03-22 DIAGNOSIS — H1045 Other chronic allergic conjunctivitis: Secondary | ICD-10-CM | POA: Diagnosis not present

## 2018-03-22 DIAGNOSIS — J3081 Allergic rhinitis due to animal (cat) (dog) hair and dander: Secondary | ICD-10-CM | POA: Diagnosis not present

## 2018-03-22 DIAGNOSIS — J301 Allergic rhinitis due to pollen: Secondary | ICD-10-CM | POA: Diagnosis not present

## 2018-03-22 DIAGNOSIS — J3089 Other allergic rhinitis: Secondary | ICD-10-CM | POA: Diagnosis not present

## 2018-03-22 DIAGNOSIS — J4520 Mild intermittent asthma, uncomplicated: Secondary | ICD-10-CM | POA: Diagnosis not present

## 2018-06-22 DIAGNOSIS — Z01419 Encounter for gynecological examination (general) (routine) without abnormal findings: Secondary | ICD-10-CM | POA: Diagnosis not present

## 2018-06-22 DIAGNOSIS — Z3A19 19 weeks gestation of pregnancy: Secondary | ICD-10-CM | POA: Diagnosis not present

## 2018-06-22 DIAGNOSIS — Z1151 Encounter for screening for human papillomavirus (HPV): Secondary | ICD-10-CM | POA: Diagnosis not present

## 2018-06-22 DIAGNOSIS — R87810 Cervical high risk human papillomavirus (HPV) DNA test positive: Secondary | ICD-10-CM | POA: Diagnosis not present

## 2018-06-22 LAB — RESULTS CONSOLE HPV: CHL HPV: NEGATIVE

## 2018-06-22 LAB — HM PAP SMEAR: HM Pap smear: NORMAL

## 2018-10-03 ENCOUNTER — Encounter: Payer: Self-pay | Admitting: Family Medicine

## 2018-10-03 ENCOUNTER — Ambulatory Visit: Payer: BLUE CROSS/BLUE SHIELD | Admitting: Family Medicine

## 2018-10-03 VITALS — BP 104/70 | HR 99 | Temp 98.5°F | Resp 16 | Ht 64.0 in | Wt 116.0 lb

## 2018-10-03 DIAGNOSIS — R110 Nausea: Secondary | ICD-10-CM | POA: Diagnosis not present

## 2018-10-03 DIAGNOSIS — J0110 Acute frontal sinusitis, unspecified: Secondary | ICD-10-CM | POA: Diagnosis not present

## 2018-10-03 DIAGNOSIS — R198 Other specified symptoms and signs involving the digestive system and abdomen: Secondary | ICD-10-CM

## 2018-10-03 MED ORDER — DICYCLOMINE HCL 20 MG PO TABS: ORAL_TABLET | ORAL | 3 refills | Status: DC

## 2018-10-03 MED ORDER — DOXYCYCLINE HYCLATE 100 MG PO CAPS: 100.0000 mg | ORAL_CAPSULE | Freq: Two times a day (BID) | ORAL | 0 refills | Status: DC

## 2018-10-03 NOTE — Progress Notes (Signed)
[place] at [facility] [address], [place], [place] [address] [phone]  Date:  10/03/2018   Name:  [name]   DOB:  [birth year]   MRN:  [id number]  PCP:  Darreld Mclean, MD    Chief Complaint: URI (started since new years drainage, nausea, coughing, congestion, fatigue, chills and hot flashes, no known fever)   History of Present Illness:  [name] is a 33 y.o. very pleasant female patient who presents with the following:  Here today for sick visit.  I last saw this patient about 2 years ago, she is generally in good health  She notes a ST, congestion on christmas.  It went away, but returned about a week ago She notes sinus and chest congestion. Ears feel full  She was having to stool after eating the last couple of days.  Now better She may feel hot and cold.  No fever noted however  She did not sleep well several days ago She may get some blood when she blows her nose Dry cough noted   History of IBS  She has an IUD in place so no chance of pregnancy.  Her youngest child is 23, so she is not breast-feeding at this time  OTC she tried muxinex and sudafed.   Her oldest had a cold, he has recovered   She did use bentyl prn in the past for IBS- would like to have some on hand to use as needed.  Her IBS is not always troublesome, but symptoms can come and go. She felt the Bentyl was helpful for her  Patient Active Problem List   Diagnosis Date Noted  . Postpartum care following vaginal delivery (3/28) 12/24/2013  . Unstable lie of fetus 12/23/2013  . Pregnancy 12/21/2013  . SAB (spontaneous abortion)--02/26/13 02/26/2013  . Vaginal bleeding in pregnancy 02/24/2013  . Dysmenorrhea 02/13/2012  . ALLERGIC RHINITIS 09/03/2009  . Asthma 09/03/2009  . GASTROENTERITIS 09/03/2009  . HEADACHE 09/03/2009  . DEEP VENOUS THROMBOPHLEBITIS, LEG, RIGHT 07/27/2007  . ADULT RESPIRATORY DISTRESS SYNDROME 07/27/2007     Past Medical History:  Diagnosis Date  . Asthma   . Bilateral leg numbness 05/24/2007  . Bronchitis, asthmatic   . DVT (deep venous thrombosis), right   . FHx: cancer   . FHx: diabetes mellitus   . FHx: heart disease   . FHx: kidney disease   . FHx: migraine headaches   . FHx: stroke   . FHx: thyroid disease   . H/O acute respiratory distress syndrome    With a history of septic shock  . H/O dysmenorrhea 2005  . H/O pyelonephritis   . H/O septic shock 05/2006  . H/O varicella   . Hepatitis    vaccine x3  . Hoarseness of voice    intermittent  . Hx: UTI (urinary tract infection)    x 1  . Low BMI 2007   During pregnancy  . Postpartum care following vaginal delivery (3/28) 12/24/2013  . Yeast infection     Past Surgical History:  Procedure Laterality Date  . WISDOM TOOTH EXTRACTION     x 4    Social History   Tobacco Use  . Smoking status: Never Smoker  . Smokeless tobacco: Never Used  Substance Use Topics  . Alcohol use: No  . Drug use: No    Family History  Problem Relation Age of Onset  . Diabetes Maternal Grandmother   .  Heart disease Maternal Grandmother        heart attack  . COPD Maternal Grandmother        bronchitis  . Hypertension Father   . Hyperlipidemia Father   . Diabetes Father   . Fibroids Mother        had hyst  . Hypertension Mother   . Hyperlipidemia Mother   . Diabetes Mother   . Fibroids Paternal Aunt   . Mental retardation Paternal Aunt   . Cancer Paternal Grandfather        brain tumor  . Hypertension Maternal Grandfather   . Stroke Maternal Grandfather   . Kidney disease Maternal Grandfather        dialysis    Allergies  Allergen Reactions  . Amoxicillin Hives    Childhood  . Lactose Intolerance (Gi)     Medication list has been reviewed and updated.  Current Outpatient Medications on File Prior to Visit  Medication Sig Dispense Refill  . levocetirizine (XYZAL) 5 MG tablet Take 5 mg by mouth daily.    Marland Kitchen  levonorgestrel (MIRENA) 20 MCG/24HR IUD 1 each by Intrauterine route once.    . montelukast (SINGULAIR) 10 MG tablet Take 10 mg by mouth daily.     No current facility-administered medications on file prior to visit.     Review of Systems: As per HPI- otherwise negative. No fever measured  Physical Examination: Vitals:   10/03/18 1010  BP: 104/70  Pulse: 99  Resp: 16  Temp: 98.5 F (36.9 C)  SpO2: 96%   Vitals:   10/03/18 1010  Weight: 116 lb (52.6 kg)  Height: 5\' 4"  (1.626 m)   Body mass index is 19.91 kg/m. Ideal Body Weight: Weight in (lb) to have BMI = 25: 145.3  GEN: WDWN, NAD, Non-toxic, A & O x 3, slim build, looks well HEENT: Atraumatic, Normocephalic. Neck supple. No masses, No LAD.  Bilateral TM wnl, oropharynx normal.  PEERL,EOMI. frontal sinuses are tender to percussion Nasal cavity is congested and erythematous Ears and Nose: No external deformity. CV: RRR, No M/G/R. No JVD. No thrill. No extra heart sounds. PULM: CTA B, no wheezes, crackles, rhonchi. No retractions. No resp. distress. No accessory muscle use. ABD: S, NT, ND EXTR: No c/c/e NEURO Normal gait.  PSYCH: Normally interactive. Conversant. Not depressed or anxious appearing.  Calm demeanor.    Assessment and Plan: Acute non-recurrent frontal sinusitis - Plan: doxycycline (VIBRAMYCIN) 100 MG capsule  Nausea without vomiting - Plan: dicyclomine (BENTYL) 20 MG tablet  Alternating constipation and diarrhea - Plan: dicyclomine (BENTYL) 20 MG tablet  Here today with an acute illness, consistent with acute sinusitis.  Treat with doxycycline. She also has history of IBS, and has used Bentyl in the past.  Gave a refill of this medication to use as needed She will let me know if her sinus symptoms are not better soon, encouraged her to see me for a physical in the next several months  Signed Lamar Blinks, MD

## 2018-10-03 NOTE — Patient Instructions (Signed)
It was nice to see you today, we will treat you for sinus infection with doxycycline.  Take this twice a day for 10 days, take with food and water.  Continue over-the-counter medications as needed, but let me know if you are not feeling better soon.  I also refilled the Bentyl for you to use as needed for IBS symptoms.  Please let us know how this is working for you.  Please also come and see me for a physical in the next several months

## 2018-10-20 DIAGNOSIS — J301 Allergic rhinitis due to pollen: Secondary | ICD-10-CM | POA: Diagnosis not present

## 2018-10-21 DIAGNOSIS — J3089 Other allergic rhinitis: Secondary | ICD-10-CM | POA: Diagnosis not present

## 2018-10-21 DIAGNOSIS — J3081 Allergic rhinitis due to animal (cat) (dog) hair and dander: Secondary | ICD-10-CM | POA: Diagnosis not present

## 2018-11-11 ENCOUNTER — Ambulatory Visit: Payer: Self-pay

## 2018-11-11 MED ORDER — OSELTAMIVIR PHOSPHATE 75 MG PO CAPS: 75.0000 mg | ORAL_CAPSULE | Freq: Every day | ORAL | 0 refills | Status: DC

## 2018-11-11 NOTE — Telephone Encounter (Signed)
Rx sent for tamiflu once daily x 10 days.  Come see [place] if symptoms worsen.

## 2018-11-11 NOTE — Telephone Encounter (Signed)
Patient is calling to state her husband has The flu and she is needing Tamiflu to be sent to her pharmacy: [id number] [place], [place]. [phone] (Phone) [phone] (Fax)

## 2018-11-11 NOTE — Addendum Note (Signed)
Addended by: Debbrah Alar on: 11/11/2018 03:59 PM   Modules accepted: Orders

## 2018-11-11 NOTE — Telephone Encounter (Signed)
Pt was diagnosed with type A flu today. Pt has cough and a runny nose that started yesterday and states her daughter has also had a cold. Denies fever, body aches or n/v.  Please advise?

## 2018-11-11 NOTE — Telephone Encounter (Signed)
Please advise as DOD.

## 2018-11-11 NOTE — Telephone Encounter (Signed)
Please advise in PCP absence.  

## 2018-11-19 NOTE — Progress Notes (Addendum)
[place] at [facility] [address], [place], [place] [address] [phone]  Date:  11/24/2018   Name:  [name]   DOB:  [birth year]   MRN:  [id number]  PCP:  Darreld Mclean, MD    Chief Complaint: Annual Exam   History of Present Illness:  [name] is a 33 y.o. very pleasant female patient who presents with the following:  Here today for complete physical She is generally in good health, but I did see her for sick visit in January.  She recovered fully DVT in 2007 due to immobilization in the ICU  She has an IUD Married with 2 children; nearly 5 and 42 yo They are in good health   Pap: per her GYN, Dr. Garwin Brothers.  05/2018 Labs: Due for complete panel. She is fasting today  Immun:?  Flu shot is done She is UTD otherwise  She is trying to exercise as much as she can given her busy life She plays with her kids Her sleep is pretty good except for some back pain, which is intermittent since her youngest was born.  She was told to visit a chiropractor and plans to do so  She notes that she may feel more down and irritable around her menstrual cycle They are tying to work with her diet around her menstrual cycle to manage this She is not interested in any meds for depression right now No SI  She does keep up with her dental care    Patient Active Problem List   Diagnosis Date Noted  . SAB (spontaneous abortion)--02/26/13 02/26/2013  . Dysmenorrhea 02/13/2012  . ALLERGIC RHINITIS 09/03/2009  . Asthma 09/03/2009  . DEEP VENOUS THROMBOPHLEBITIS, LEG, RIGHT 07/27/2007  . ADULT RESPIRATORY DISTRESS SYNDROME 07/27/2007    Past Medical History:  Diagnosis Date  . Asthma   . Bilateral leg numbness 05/24/2007  . Bronchitis, asthmatic   . DVT (deep venous thrombosis), right   . FHx: cancer   . FHx: diabetes mellitus   . FHx: heart disease   . FHx: kidney disease   . FHx: migraine headaches   . FHx: stroke   .  FHx: thyroid disease   . H/O acute respiratory distress syndrome    With a history of septic shock  . H/O dysmenorrhea 2005  . H/O pyelonephritis   . H/O septic shock 05/2006  . H/O varicella   . Hepatitis    vaccine x3  . Hoarseness of voice    intermittent  . Hx: UTI (urinary tract infection)    x 1  . Low BMI 2007   During pregnancy  . Postpartum care following vaginal delivery (3/28) 12/24/2013  . Yeast infection     Past Surgical History:  Procedure Laterality Date  . WISDOM TOOTH EXTRACTION     x 4    Social History   Tobacco Use  . Smoking status: Never Smoker  . Smokeless tobacco: Never Used  Substance Use Topics  . Alcohol use: No  . Drug use: No    Family History  Problem Relation Age of Onset  . Diabetes Maternal Grandmother   . Heart disease Maternal Grandmother        heart attack  . COPD Maternal Grandmother        bronchitis  . Hypertension Father   . Hyperlipidemia Father   . Diabetes Father   . Fibroids Mother  had hyst  . Hypertension Mother   . Hyperlipidemia Mother   . Diabetes Mother   . Fibroids Paternal Aunt   . Mental retardation Paternal Aunt   . Cancer Paternal Grandfather        brain tumor  . Hypertension Maternal Grandfather   . Stroke Maternal Grandfather   . Kidney disease Maternal Grandfather        dialysis    Allergies  Allergen Reactions  . Amoxicillin Hives    Childhood  . Lactose Intolerance (Gi)     Medication list has been reviewed and updated.  Current Outpatient Medications on File Prior to Visit  Medication Sig Dispense Refill  . dicyclomine (BENTYL) 20 MG tablet Take up to 4x daily as needed for IBS symptoms 60 tablet 3  . levocetirizine (XYZAL) 5 MG tablet Take 5 mg by mouth daily.    Marland Kitchen levonorgestrel (MIRENA) 20 MCG/24HR IUD 1 each by Intrauterine route once.    . montelukast (SINGULAIR) 10 MG tablet Take 10 mg by mouth daily.     No current facility-administered medications on file prior to  visit.     Review of Systems:  As per HPI- otherwise negative. No fever chills, no chest pain or shortness of breath Family history-asked about any breast cancer history.  Patient reports that her father was 1 of 87 children.  1 of his sisters had breast cancer when she was in her 20s.  Otherwise there is no history that she is aware of   Physical Examination: Vitals:   11/24/18 0842  BP: 102/62  Pulse: 94  Resp: 16  SpO2: 97%   Vitals:   11/24/18 0842  Weight: 117 lb (53.1 kg)  Height: 5\' 4"  (1.626 m)   Body mass index is 20.08 kg/m. Ideal Body Weight: Weight in (lb) to have BMI = 25: 145.3  GEN: WDWN, NAD, Non-toxic, A & O x 3, slim build, looks well HEENT: Atraumatic, Normocephalic. Neck supple. No masses, No LAD.  Bilateral TM wnl, oropharynx normal.  PEERL,EOMI.   Ears and Nose: No external deformity. CV: RRR, No M/G/R. No JVD. No thrill. No extra heart sounds. PULM: CTA B, no wheezes, crackles, rhonchi. No retractions. No resp. distress. No accessory muscle use. ABD: S, NT, ND, +BS. No rebound. No HSM. EXTR: No c/c/e NEURO Normal gait.  PSYCH: Normally interactive. Conversant. Not depressed or anxious appearing.  Calm demeanor.    Assessment and Plan: Physical exam  Screening for diabetes mellitus - Plan: Comprehensive metabolic panel, Hemoglobin A1c  Screening for deficiency anemia - Plan: CBC  Screening for hyperlipidemia - Plan: Lipid panel  Fatigue, unspecified type - Plan: TSH  Here today for complete physical.  She is overall feeling well, does get tired sometimes.  She assumes this is due to her busy lifestyle and 2 young kids.  We will check her thyroid She notes some moodiness around the time of her menstrual cycle, her GYN has encouraged her to use diet and exercise to manage this.  The time being she does not wish to use any antidepressant medication, but I have asked her to keep me posted about her condition Labs plan as above-I will follow-up with  her pending the results Plan for physical in 1 year  Signed Lamar Blinks, MD  Received her labs, message to patient  Results for orders placed or performed in visit on 11/24/18  CBC  Result Value Ref Range   WBC 6.2 4.0 - 10.5 K/uL   RBC  4.16 3.87 - 5.11 Mil/uL   Platelets 306.0 150.0 - 400.0 K/uL   Hemoglobin 12.2 12.0 - 15.0 g/dL   HCT 35.8 (L) 36.0 - 46.0 %   MCV 86.2 78.0 - 100.0 fl   MCHC 34.0 30.0 - 36.0 g/dL   RDW 13.0 11.5 - 15.5 %  Comprehensive metabolic panel  Result Value Ref Range   Sodium 140 135 - 145 mEq/L   Potassium 3.8 3.5 - 5.1 mEq/L   Chloride 104 96 - 112 mEq/L   CO2 28 19 - 32 mEq/L   Glucose, Bld 84 70 - 99 mg/dL   BUN 12 6 - 23 mg/dL   Creatinine, Ser 0.53 0.40 - 1.20 mg/dL   Total Bilirubin 0.6 0.2 - 1.2 mg/dL   Alkaline Phosphatase 60 39 - 117 U/L   AST 13 0 - 37 U/L   ALT 9 0 - 35 U/L   Total Protein 7.4 6.0 - 8.3 g/dL   Albumin 4.8 3.5 - 5.2 g/dL   Calcium 9.7 8.4 - 10.5 mg/dL   GFR 158.99 >60.00 mL/min  Hemoglobin A1c  Result Value Ref Range   Hgb A1c MFr Bld 5.6 4.6 - 6.5 %  Lipid panel  Result Value Ref Range   Cholesterol 133 0 - 200 mg/dL   Triglycerides 55.0 0.0 - 149.0 mg/dL   HDL 40.40 >39.00 mg/dL   VLDL 11.0 0.0 - 40.0 mg/dL   LDL Cholesterol 82 0 - 99 mg/dL   Total CHOL/HDL Ratio 3    NonHDL 93.00   TSH  Result Value Ref Range   TSH 1.36 0.35 - 4.50 uIU/mL

## 2018-11-24 ENCOUNTER — Ambulatory Visit (INDEPENDENT_AMBULATORY_CARE_PROVIDER_SITE_OTHER): Payer: BLUE CROSS/BLUE SHIELD | Admitting: Family Medicine

## 2018-11-24 ENCOUNTER — Encounter: Payer: Self-pay | Admitting: Family Medicine

## 2018-11-24 VITALS — BP 102/62 | HR 94 | Resp 16 | Ht 64.0 in | Wt 117.0 lb

## 2018-11-24 DIAGNOSIS — R5383 Other fatigue: Secondary | ICD-10-CM

## 2018-11-24 DIAGNOSIS — Z130 Encounter for screening for diseases of the blood and blood-forming organs and certain disorders involving the immune mechanism: Secondary | ICD-10-CM | POA: Diagnosis not present

## 2018-11-24 DIAGNOSIS — Z0000 Encounter for general adult medical examination without abnormal findings: Secondary | ICD-10-CM | POA: Diagnosis not present

## 2018-11-24 DIAGNOSIS — Z131 Encounter for screening for diabetes mellitus: Secondary | ICD-10-CM | POA: Diagnosis not present

## 2018-11-24 DIAGNOSIS — Z13220 Encounter for screening for lipoid disorders: Secondary | ICD-10-CM | POA: Diagnosis not present

## 2018-11-24 LAB — COMPREHENSIVE METABOLIC PANEL
ALK PHOS: 60 U/L (ref 39–117)
ALT: 9 U/L (ref 0–35)
AST: 13 U/L (ref 0–37)
Albumin: 4.8 g/dL (ref 3.5–5.2)
BILIRUBIN TOTAL: 0.6 mg/dL (ref 0.2–1.2)
BUN: 12 mg/dL (ref 6–23)
CO2: 28 meq/L (ref 19–32)
CREATININE: 0.53 mg/dL (ref 0.40–1.20)
Calcium: 9.7 mg/dL (ref 8.4–10.5)
Chloride: 104 mEq/L (ref 96–112)
GFR: 158.99 mL/min (ref >60.00)
Glucose, Bld: 84 mg/dL (ref 70–99)
Potassium: 3.8 mEq/L (ref 3.5–5.1)
Sodium: 140 mEq/L (ref 135–145)
TOTAL PROTEIN: 7.4 g/dL (ref 6.0–8.3)

## 2018-11-24 LAB — CBC
HCT: 35.8 % — ABNORMAL LOW (ref 36.0–46.0)
HEMOGLOBIN: 12.2 g/dL (ref 12.0–15.0)
MCHC: 34 g/dL (ref 30.0–36.0)
MCV: 86.2 fl (ref 78.0–100.0)
PLATELETS: 306 10*3/uL (ref 150.0–400.0)
RBC: 4.16 Mil/uL (ref 3.87–5.11)
RDW: 13 % (ref 11.5–15.5)
WBC: 6.2 10*3/uL (ref 4.0–10.5)

## 2018-11-24 LAB — TSH: TSH: 1.36 u[IU]/mL (ref 0.35–4.50)

## 2018-11-24 LAB — LIPID PANEL
Cholesterol: 133 mg/dL (ref 0–200)
HDL: 40.4 mg/dL (ref >39.00)
LDL Cholesterol: 82 mg/dL (ref 0–99)
NonHDL: 93
Total CHOL/HDL Ratio: 3
Triglycerides: 55 mg/dL (ref 0.0–149.0)
VLDL: 11 mg/dL (ref 0.0–40.0)

## 2018-11-24 LAB — HEMOGLOBIN A1C: Hgb A1c MFr Bld: 5.6 % (ref 4.6–6.5)

## 2018-11-24 NOTE — Patient Instructions (Addendum)
It was great to see you today I will be in touch with your labs ASAP Continue to work on managing your menstrual symptoms with diet and exercise.  If you decide you would like to try medication for any depression symptoms, please just let me know  Assuming all is well, we can plan to meet for physical in 1 year  Health Maintenance, Female Adopting a healthy lifestyle and getting preventive care can go a long way to promote health and wellness. Talk with your health care provider about what schedule of regular examinations is right for you. This is a good chance for you to check in with your provider about disease prevention and staying healthy. In between checkups, there are plenty of things you can do on your own. Experts have done a lot of research about which lifestyle changes and preventive measures are most likely to keep you healthy. Ask your health care provider for more information. Weight and diet Eat a healthy diet  Be sure to include plenty of vegetables, fruits, low-fat dairy products, and lean protein.  Do not eat a lot of foods high in solid fats, added sugars, or salt.  Get regular exercise. This is one of the most important things you can do for your health. ? Most adults should exercise for at least 150 minutes each week. The exercise should increase your heart rate and make you sweat (moderate-intensity exercise). ? Most adults should also do strengthening exercises at least twice a week. This is in addition to the moderate-intensity exercise. Maintain a healthy weight  Body mass index (BMI) is a measurement that can be used to identify possible weight problems. It estimates body fat based on height and weight. Your health care provider can help determine your BMI and help you achieve or maintain a healthy weight.  For females 68 years of age and older: ? A BMI below 18.5 is considered underweight. ? A BMI of 18.5 to 24.9 is normal. ? A BMI of 25 to 29.9 is considered  overweight. ? A BMI of 30 and above is considered obese. Watch levels of cholesterol and blood lipids  You should start having your blood tested for lipids and cholesterol at 35 years of age, then have this test every 5 years.  You may need to have your cholesterol levels checked more often if: ? Your lipid or cholesterol levels are high. ? You are older than 35 years of age. ? You are at high risk for heart disease. Cancer screening Lung Cancer  Lung cancer screening is recommended for adults 59-78 years old who are at high risk for lung cancer because of a history of smoking.  A yearly low-dose CT scan of the lungs is recommended for people who: ? Currently smoke. ? Have quit within the past 15 years. ? Have at least a 30-pack-year history of smoking. A pack year is smoking an average of one pack of cigarettes a day for 1 year.  Yearly screening should continue until it has been 15 years since you quit.  Yearly screening should stop if you develop a health problem that would prevent you from having lung cancer treatment. Breast Cancer  Practice breast self-awareness. This means understanding how your breasts normally appear and feel.  It also means doing regular breast self-exams. Let your health care provider know about any changes, no matter how small.  If you are in your 20s or 30s, you should have a clinical breast exam (CBE) by a health  care provider every 1-3 years as part of a regular health exam.  If you are 63 or older, have a CBE every year. Also consider having a breast X-ray (mammogram) every year.  If you have a family history of breast cancer, talk to your health care provider about genetic screening.  If you are at high risk for breast cancer, talk to your health care provider about having an MRI and a mammogram every year.  Breast cancer gene (BRCA) assessment is recommended for women who have family members with BRCA-related cancers. BRCA-related cancers  include: ? Breast. ? Ovarian. ? Tubal. ? Peritoneal cancers.  Results of the assessment will determine the need for genetic counseling and BRCA1 and BRCA2 testing. Cervical Cancer Your health care provider may recommend that you be screened regularly for cancer of the pelvic organs (ovaries, uterus, and vagina). This screening involves a pelvic examination, including checking for microscopic changes to the surface of your cervix (Pap test). You may be encouraged to have this screening done every 3 years, beginning at age 1.  For women ages 87-65, health care providers may recommend pelvic exams and Pap testing every 3 years, or they may recommend the Pap and pelvic exam, combined with testing for human papilloma virus (HPV), every 5 years. Some types of HPV increase your risk of cervical cancer. Testing for HPV may also be done on women of any age with unclear Pap test results.  Other health care providers may not recommend any screening for nonpregnant women who are considered low risk for pelvic cancer and who do not have symptoms. Ask your health care provider if a screening pelvic exam is right for you.  If you have had past treatment for cervical cancer or a condition that could lead to cancer, you need Pap tests and screening for cancer for at least 20 years after your treatment. If Pap tests have been discontinued, your risk factors (such as having a new sexual partner) need to be reassessed to determine if screening should resume. Some women have medical problems that increase the chance of getting cervical cancer. In these cases, your health care provider may recommend more frequent screening and Pap tests. Colorectal Cancer  This type of cancer can be detected and often prevented.  Routine colorectal cancer screening usually begins at 35 years of age and continues through 35 years of age.  Your health care provider may recommend screening at an earlier age if you have risk factors for  colon cancer.  Your health care provider may also recommend using home test kits to check for hidden blood in the stool.  A small camera at the end of a tube can be used to examine your colon directly (sigmoidoscopy or colonoscopy). This is done to check for the earliest forms of colorectal cancer.  Routine screening usually begins at age 64.  Direct examination of the colon should be repeated every 5-10 years through 35 years of age. However, you may need to be screened more often if early forms of precancerous polyps or small growths are found. Skin Cancer  Check your skin from head to toe regularly.  Tell your health care provider about any new moles or changes in moles, especially if there is a change in a mole's shape or color.  Also tell your health care provider if you have a mole that is larger than the size of a pencil eraser.  Always use sunscreen. Apply sunscreen liberally and repeatedly throughout the day.  Protect  yourself by wearing long sleeves, pants, a wide-brimmed hat, and sunglasses whenever you are outside. Heart disease, diabetes, and high blood pressure  High blood pressure causes heart disease and increases the risk of stroke. High blood pressure is more likely to develop in: ? People who have blood pressure in the high end of the normal range (130-139/85-89 mm Hg). ? People who are overweight or obese. ? People who are African American.  If you are 67-22 years of age, have your blood pressure checked every 3-5 years. If you are 66 years of age or older, have your blood pressure checked every year. You should have your blood pressure measured twice-once when you are at a hospital or clinic, and once when you are not at a hospital or clinic. Record the average of the two measurements. To check your blood pressure when you are not at a hospital or clinic, you can use: ? An automated blood pressure machine at a pharmacy. ? A home blood pressure monitor.  If you are  between 37 years and 26 years old, ask your health care provider if you should take aspirin to prevent strokes.  Have regular diabetes screenings. This involves taking a blood sample to check your fasting blood sugar level. ? If you are at a normal weight and have a low risk for diabetes, have this test once every three years after 35 years of age. ? If you are overweight and have a high risk for diabetes, consider being tested at a younger age or more often. Preventing infection Hepatitis B  If you have a higher risk for hepatitis B, you should be screened for this virus. You are considered at high risk for hepatitis B if: ? You were born in a country where hepatitis B is common. Ask your health care provider which countries are considered high risk. ? Your parents were born in a high-risk country, and you have not been immunized against hepatitis B (hepatitis B vaccine). ? You have HIV or AIDS. ? You use needles to inject street drugs. ? You live with someone who has hepatitis B. ? You have had sex with someone who has hepatitis B. ? You get hemodialysis treatment. ? You take certain medicines for conditions, including cancer, organ transplantation, and autoimmune conditions. Hepatitis C  Blood testing is recommended for: ? Everyone born from [birth year] through 1967. ? Anyone with known risk factors for hepatitis C. Sexually transmitted infections (STIs)  You should be screened for sexually transmitted infections (STIs) including gonorrhea and chlamydia if: ? You are sexually active and are younger than 35 years of age. ? You are older than 35 years of age and your health care provider tells you that you are at risk for this type of infection. ? Your sexual activity has changed since you were last screened and you are at an increased risk for chlamydia or gonorrhea. Ask your health care provider if you are at risk.  If you do not have HIV, but are at risk, it may be recommended that you take  a prescription medicine daily to prevent HIV infection. This is called pre-exposure prophylaxis (PrEP). You are considered at risk if: ? You are sexually active and do not regularly use condoms or know the HIV status of your partner(s). ? You take drugs by injection. ? You are sexually active with a partner who has HIV. Talk with your health care provider about whether you are at high risk of being infected with HIV. If  you choose to begin PrEP, you should first be tested for HIV. You should then be tested every 3 months for as long as you are taking PrEP. Pregnancy  If you are premenopausal and you may become pregnant, ask your health care provider about preconception counseling.  If you may become pregnant, take 400 to 800 micrograms (mcg) of folic acid every day.  If you want to prevent pregnancy, talk to your health care provider about birth control (contraception). Osteoporosis and menopause  Osteoporosis is a disease in which the bones lose minerals and strength with aging. This can result in serious bone fractures. Your risk for osteoporosis can be identified using a bone density scan.  If you are 65 years of age or older, or if you are at risk for osteoporosis and fractures, ask your health care provider if you should be screened.  Ask your health care provider whether you should take a calcium or vitamin D supplement to lower your risk for osteoporosis.  Menopause may have certain physical symptoms and risks.  Hormone replacement therapy may reduce some of these symptoms and risks. Talk to your health care provider about whether hormone replacement therapy is right for you. Follow these instructions at home:  Schedule regular health, dental, and eye exams.  Stay current with your immunizations.  Do not use any tobacco products including cigarettes, chewing tobacco, or electronic cigarettes.  If you are pregnant, do not drink alcohol.  If you are breastfeeding, limit how  much and how often you drink alcohol.  Limit alcohol intake to no more than 1 drink per day for nonpregnant women. One drink equals 12 ounces of beer, 5 ounces of wine, or 1 ounces of hard liquor.  Do not use street drugs.  Do not share needles.  Ask your health care provider for help if you need support or information about quitting drugs.  Tell your health care provider if you often feel depressed.  Tell your health care provider if you have ever been abused or do not feel safe at home. This information is not intended to replace advice given to you by your health care provider. Make sure you discuss any questions you have with your health care provider. Document Released: 03/30/2011 Document Revised: 02/20/2016 Document Reviewed: 06/18/2015 Elsevier Interactive Patient Education  2019 [employer].

## 2018-11-28 DIAGNOSIS — M9905 Segmental and somatic dysfunction of pelvic region: Secondary | ICD-10-CM | POA: Diagnosis not present

## 2018-11-28 DIAGNOSIS — M9901 Segmental and somatic dysfunction of cervical region: Secondary | ICD-10-CM | POA: Diagnosis not present

## 2018-11-28 DIAGNOSIS — M9902 Segmental and somatic dysfunction of thoracic region: Secondary | ICD-10-CM | POA: Diagnosis not present

## 2018-11-28 DIAGNOSIS — M9903 Segmental and somatic dysfunction of lumbar region: Secondary | ICD-10-CM | POA: Diagnosis not present

## 2018-12-01 DIAGNOSIS — M9903 Segmental and somatic dysfunction of lumbar region: Secondary | ICD-10-CM | POA: Diagnosis not present

## 2018-12-01 DIAGNOSIS — G4700 Insomnia, unspecified: Secondary | ICD-10-CM | POA: Diagnosis not present

## 2018-12-01 DIAGNOSIS — M9901 Segmental and somatic dysfunction of cervical region: Secondary | ICD-10-CM | POA: Diagnosis not present

## 2018-12-01 DIAGNOSIS — M9902 Segmental and somatic dysfunction of thoracic region: Secondary | ICD-10-CM | POA: Diagnosis not present

## 2018-12-05 DIAGNOSIS — M9901 Segmental and somatic dysfunction of cervical region: Secondary | ICD-10-CM | POA: Diagnosis not present

## 2018-12-05 DIAGNOSIS — M9903 Segmental and somatic dysfunction of lumbar region: Secondary | ICD-10-CM | POA: Diagnosis not present

## 2018-12-05 DIAGNOSIS — M9902 Segmental and somatic dysfunction of thoracic region: Secondary | ICD-10-CM | POA: Diagnosis not present

## 2018-12-05 DIAGNOSIS — G4700 Insomnia, unspecified: Secondary | ICD-10-CM | POA: Diagnosis not present

## 2018-12-08 DIAGNOSIS — M9903 Segmental and somatic dysfunction of lumbar region: Secondary | ICD-10-CM | POA: Diagnosis not present

## 2018-12-08 DIAGNOSIS — G4700 Insomnia, unspecified: Secondary | ICD-10-CM | POA: Diagnosis not present

## 2018-12-08 DIAGNOSIS — M9901 Segmental and somatic dysfunction of cervical region: Secondary | ICD-10-CM | POA: Diagnosis not present

## 2018-12-08 DIAGNOSIS — M9902 Segmental and somatic dysfunction of thoracic region: Secondary | ICD-10-CM | POA: Diagnosis not present

## 2018-12-12 DIAGNOSIS — G4700 Insomnia, unspecified: Secondary | ICD-10-CM | POA: Diagnosis not present

## 2018-12-12 DIAGNOSIS — M9901 Segmental and somatic dysfunction of cervical region: Secondary | ICD-10-CM | POA: Diagnosis not present

## 2018-12-12 DIAGNOSIS — M9902 Segmental and somatic dysfunction of thoracic region: Secondary | ICD-10-CM | POA: Diagnosis not present

## 2018-12-12 DIAGNOSIS — M9903 Segmental and somatic dysfunction of lumbar region: Secondary | ICD-10-CM | POA: Diagnosis not present

## 2018-12-14 DIAGNOSIS — J3081 Allergic rhinitis due to animal (cat) (dog) hair and dander: Secondary | ICD-10-CM | POA: Diagnosis not present

## 2018-12-14 DIAGNOSIS — J4520 Mild intermittent asthma, uncomplicated: Secondary | ICD-10-CM | POA: Diagnosis not present

## 2018-12-14 DIAGNOSIS — J301 Allergic rhinitis due to pollen: Secondary | ICD-10-CM | POA: Diagnosis not present

## 2018-12-14 DIAGNOSIS — H1045 Other chronic allergic conjunctivitis: Secondary | ICD-10-CM | POA: Diagnosis not present

## 2018-12-15 DIAGNOSIS — M9903 Segmental and somatic dysfunction of lumbar region: Secondary | ICD-10-CM | POA: Diagnosis not present

## 2018-12-15 DIAGNOSIS — M9902 Segmental and somatic dysfunction of thoracic region: Secondary | ICD-10-CM | POA: Diagnosis not present

## 2018-12-15 DIAGNOSIS — G4700 Insomnia, unspecified: Secondary | ICD-10-CM | POA: Diagnosis not present

## 2018-12-15 DIAGNOSIS — M9901 Segmental and somatic dysfunction of cervical region: Secondary | ICD-10-CM | POA: Diagnosis not present

## 2018-12-19 DIAGNOSIS — M9903 Segmental and somatic dysfunction of lumbar region: Secondary | ICD-10-CM | POA: Diagnosis not present

## 2018-12-19 DIAGNOSIS — G4700 Insomnia, unspecified: Secondary | ICD-10-CM | POA: Diagnosis not present

## 2018-12-19 DIAGNOSIS — M9902 Segmental and somatic dysfunction of thoracic region: Secondary | ICD-10-CM | POA: Diagnosis not present

## 2018-12-19 DIAGNOSIS — M9901 Segmental and somatic dysfunction of cervical region: Secondary | ICD-10-CM | POA: Diagnosis not present

## 2018-12-22 DIAGNOSIS — M9903 Segmental and somatic dysfunction of lumbar region: Secondary | ICD-10-CM | POA: Diagnosis not present

## 2018-12-22 DIAGNOSIS — G4700 Insomnia, unspecified: Secondary | ICD-10-CM | POA: Diagnosis not present

## 2018-12-22 DIAGNOSIS — M9902 Segmental and somatic dysfunction of thoracic region: Secondary | ICD-10-CM | POA: Diagnosis not present

## 2018-12-22 DIAGNOSIS — M9901 Segmental and somatic dysfunction of cervical region: Secondary | ICD-10-CM | POA: Diagnosis not present

## 2018-12-26 DIAGNOSIS — G4700 Insomnia, unspecified: Secondary | ICD-10-CM | POA: Diagnosis not present

## 2018-12-26 DIAGNOSIS — M9901 Segmental and somatic dysfunction of cervical region: Secondary | ICD-10-CM | POA: Diagnosis not present

## 2018-12-26 DIAGNOSIS — M9903 Segmental and somatic dysfunction of lumbar region: Secondary | ICD-10-CM | POA: Diagnosis not present

## 2018-12-26 DIAGNOSIS — M9902 Segmental and somatic dysfunction of thoracic region: Secondary | ICD-10-CM | POA: Diagnosis not present

## 2018-12-30 DIAGNOSIS — M9903 Segmental and somatic dysfunction of lumbar region: Secondary | ICD-10-CM | POA: Diagnosis not present

## 2018-12-30 DIAGNOSIS — M9902 Segmental and somatic dysfunction of thoracic region: Secondary | ICD-10-CM | POA: Diagnosis not present

## 2018-12-30 DIAGNOSIS — M9901 Segmental and somatic dysfunction of cervical region: Secondary | ICD-10-CM | POA: Diagnosis not present

## 2018-12-30 DIAGNOSIS — G4700 Insomnia, unspecified: Secondary | ICD-10-CM | POA: Diagnosis not present

## 2019-01-03 DIAGNOSIS — G4700 Insomnia, unspecified: Secondary | ICD-10-CM | POA: Diagnosis not present

## 2019-01-03 DIAGNOSIS — M9903 Segmental and somatic dysfunction of lumbar region: Secondary | ICD-10-CM | POA: Diagnosis not present

## 2019-01-03 DIAGNOSIS — M9902 Segmental and somatic dysfunction of thoracic region: Secondary | ICD-10-CM | POA: Diagnosis not present

## 2019-01-03 DIAGNOSIS — M9901 Segmental and somatic dysfunction of cervical region: Secondary | ICD-10-CM | POA: Diagnosis not present

## 2019-01-06 DIAGNOSIS — G4700 Insomnia, unspecified: Secondary | ICD-10-CM | POA: Diagnosis not present

## 2019-01-06 DIAGNOSIS — M9903 Segmental and somatic dysfunction of lumbar region: Secondary | ICD-10-CM | POA: Diagnosis not present

## 2019-01-06 DIAGNOSIS — M9901 Segmental and somatic dysfunction of cervical region: Secondary | ICD-10-CM | POA: Diagnosis not present

## 2019-01-06 DIAGNOSIS — M9902 Segmental and somatic dysfunction of thoracic region: Secondary | ICD-10-CM | POA: Diagnosis not present

## 2019-01-08 ENCOUNTER — Encounter (HOSPITAL_COMMUNITY): Payer: Self-pay | Admitting: Emergency Medicine

## 2019-01-08 ENCOUNTER — Emergency Department (HOSPITAL_COMMUNITY): Payer: BLUE CROSS/BLUE SHIELD

## 2019-01-08 ENCOUNTER — Observation Stay (HOSPITAL_COMMUNITY)
Admission: EM | Admit: 2019-01-08 | Discharge: 2019-01-09 | Disposition: A | Discharge status: Home / Self Care | Payer: BLUE CROSS/BLUE SHIELD | Attending: Internal Medicine | Admitting: Internal Medicine

## 2019-01-08 ENCOUNTER — Other Ambulatory Visit: Payer: Self-pay

## 2019-01-08 DIAGNOSIS — K589 Irritable bowel syndrome without diarrhea: Secondary | ICD-10-CM | POA: Insufficient documentation

## 2019-01-08 DIAGNOSIS — N1330 Unspecified hydronephrosis: Secondary | ICD-10-CM

## 2019-01-08 DIAGNOSIS — E876 Hypokalemia: Secondary | ICD-10-CM | POA: Diagnosis not present

## 2019-01-08 DIAGNOSIS — R1900 Intra-abdominal and pelvic swelling, mass and lump, unspecified site: Secondary | ICD-10-CM | POA: Diagnosis not present

## 2019-01-08 DIAGNOSIS — Z86718 Personal history of other venous thrombosis and embolism: Secondary | ICD-10-CM | POA: Diagnosis not present

## 2019-01-08 DIAGNOSIS — Z880 Allergy status to penicillin: Secondary | ICD-10-CM | POA: Diagnosis not present

## 2019-01-08 DIAGNOSIS — Z79899 Other long term (current) drug therapy: Secondary | ICD-10-CM | POA: Insufficient documentation

## 2019-01-08 DIAGNOSIS — N809 Endometriosis, unspecified: Secondary | ICD-10-CM | POA: Diagnosis not present

## 2019-01-08 DIAGNOSIS — R1909 Other intra-abdominal and pelvic swelling, mass and lump: Secondary | ICD-10-CM | POA: Diagnosis not present

## 2019-01-08 DIAGNOSIS — N134 Hydroureter: Secondary | ICD-10-CM | POA: Diagnosis not present

## 2019-01-08 DIAGNOSIS — R52 Pain, unspecified: Secondary | ICD-10-CM

## 2019-01-08 DIAGNOSIS — Z793 Long term (current) use of hormonal contraceptives: Secondary | ICD-10-CM | POA: Insufficient documentation

## 2019-01-08 DIAGNOSIS — M549 Dorsalgia, unspecified: Secondary | ICD-10-CM | POA: Insufficient documentation

## 2019-01-08 DIAGNOSIS — R1031 Right lower quadrant pain: Secondary | ICD-10-CM | POA: Diagnosis not present

## 2019-01-08 DIAGNOSIS — J45909 Unspecified asthma, uncomplicated: Secondary | ICD-10-CM | POA: Insufficient documentation

## 2019-01-08 DIAGNOSIS — R110 Nausea: Secondary | ICD-10-CM | POA: Diagnosis not present

## 2019-01-08 DIAGNOSIS — R102 Pelvic and perineal pain: Secondary | ICD-10-CM | POA: Diagnosis not present

## 2019-01-08 LAB — COMPREHENSIVE METABOLIC PANEL
ALT: 11 U/L (ref 0–44)
AST: 19 U/L (ref 15–41)
Albumin: 4.2 g/dL (ref 3.5–5.0)
Alkaline Phosphatase: 54 U/L (ref 38–126)
Anion gap: 13 (ref 5–15)
BUN: <5 mg/dL — ABNORMAL LOW (ref 6–20)
CO2: 21 mmol/L — ABNORMAL LOW (ref 22–32)
Calcium: 9 mg/dL (ref 8.9–10.3)
Chloride: 105 mmol/L (ref 98–111)
Creatinine, Ser: 0.52 mg/dL (ref 0.44–1.00)
GFR calc Af Amer: >60 mL/min (ref >60)
GFR calc non Af Amer: >60 mL/min (ref >60)
Glucose, Bld: 163 mg/dL — ABNORMAL HIGH (ref 70–99)
Potassium: 3 mmol/L — ABNORMAL LOW (ref 3.5–5.1)
Sodium: 139 mmol/L (ref 135–145)
Total Bilirubin: 0.7 mg/dL (ref 0.3–1.2)
Total Protein: 6.7 g/dL (ref 6.5–8.1)

## 2019-01-08 LAB — URINALYSIS, ROUTINE W REFLEX MICROSCOPIC
Bilirubin Urine: NEGATIVE
Glucose, UA: NEGATIVE mg/dL
Hgb urine dipstick: NEGATIVE
Ketones, ur: 20 mg/dL — AB
Leukocytes,Ua: NEGATIVE
Nitrite: NEGATIVE
Protein, ur: NEGATIVE mg/dL
Specific Gravity, Urine: 1.009 (ref 1.005–1.030)
pH: 6 (ref 5.0–8.0)

## 2019-01-08 LAB — CBC WITH DIFFERENTIAL/PLATELET
Abs Immature Granulocytes: 0.03 10*3/uL (ref 0.00–0.07)
Basophils Absolute: 0 10*3/uL (ref 0.0–0.1)
Basophils Relative: 0 %
Eosinophils Absolute: 0.1 10*3/uL (ref 0.0–0.5)
Eosinophils Relative: 1 %
HCT: 33.6 % — ABNORMAL LOW (ref 36.0–46.0)
Hemoglobin: 11.1 g/dL — ABNORMAL LOW (ref 12.0–15.0)
Immature Granulocytes: 0 %
Lymphocytes Relative: 21 %
Lymphs Abs: 2.7 10*3/uL (ref 0.7–4.0)
MCH: 28.8 pg (ref 26.0–34.0)
MCHC: 33 g/dL (ref 30.0–36.0)
MCV: 87.3 fL (ref 80.0–100.0)
Monocytes Absolute: 0.5 10*3/uL (ref 0.1–1.0)
Monocytes Relative: 4 %
Neutro Abs: 9.7 10*3/uL — ABNORMAL HIGH (ref 1.7–7.7)
Neutrophils Relative %: 74 %
Platelets: 235 10*3/uL (ref 150–400)
RBC: 3.85 MIL/uL — ABNORMAL LOW (ref 3.87–5.11)
RDW: 12.7 % (ref 11.5–15.5)
WBC: 13 10*3/uL — ABNORMAL HIGH (ref 4.0–10.5)
nRBC: 0 % (ref 0.0–0.2)

## 2019-01-08 LAB — WET PREP, GENITAL
Clue Cells Wet Prep HPF POC: NONE SEEN
Trich, Wet Prep: NONE SEEN
Yeast Wet Prep HPF POC: NONE SEEN

## 2019-01-08 LAB — BASIC METABOLIC PANEL
Anion gap: 10 (ref 5–15)
BUN: 7 mg/dL (ref 6–20)
CO2: 23 mmol/L (ref 22–32)
Calcium: 9.2 mg/dL (ref 8.9–10.3)
Chloride: 107 mmol/L (ref 98–111)
Creatinine, Ser: 0.59 mg/dL (ref 0.44–1.00)
GFR calc Af Amer: >60 mL/min (ref >60)
GFR calc non Af Amer: >60 mL/min (ref >60)
Glucose, Bld: 133 mg/dL — ABNORMAL HIGH (ref 70–99)
Potassium: 3.6 mmol/L (ref 3.5–5.1)
Sodium: 140 mmol/L (ref 135–145)

## 2019-01-08 LAB — LIPASE, BLOOD: Lipase: 18 U/L (ref 11–51)

## 2019-01-08 LAB — I-STAT BETA HCG BLOOD, ED (MC, WL, AP ONLY): I-stat hCG, quantitative: <5 m[IU]/mL (ref <5)

## 2019-01-08 MED ORDER — ONDANSETRON HCL 4 MG/2ML IJ SOLN
4.0000 mg | Freq: Once | INTRAMUSCULAR | Status: AC
  Administered 2019-01-08: 11:00:00 4 mg via INTRAVENOUS
  Filled 2019-01-08: qty 2

## 2019-01-08 MED ORDER — HYDROMORPHONE HCL 1 MG/ML IJ SOLN
1.0000 mg | Freq: Once | INTRAMUSCULAR | Status: AC
  Administered 2019-01-08: 13:00:00 1 mg via INTRAVENOUS
  Filled 2019-01-08: qty 1

## 2019-01-08 MED ORDER — LEVOCETIRIZINE DIHYDROCHLORIDE 5 MG PO TABS: 5.0000 mg | ORAL_TABLET | Freq: Every day | ORAL | Status: DC

## 2019-01-08 MED ORDER — IOHEXOL 300 MG/ML  SOLN
100.0000 mL | Freq: Once | INTRAMUSCULAR | Status: AC | PRN
  Administered 2019-01-08: 13:00:00 100 mL via INTRAVENOUS

## 2019-01-08 MED ORDER — DOCUSATE SODIUM 100 MG PO CAPS
100.0000 mg | ORAL_CAPSULE | Freq: Two times a day (BID) | ORAL | Status: DC
  Administered 2019-01-08 – 2019-01-09 (×2): 100 mg via ORAL
  Filled 2019-01-08 (×2): qty 1

## 2019-01-08 MED ORDER — POLYETHYLENE GLYCOL 3350 17 G PO PACK: 17.0000 g | PACK | Freq: Every day | ORAL | Status: DC | PRN

## 2019-01-08 MED ORDER — DICYCLOMINE HCL 20 MG PO TABS
20.0000 mg | ORAL_TABLET | Freq: Four times a day (QID) | ORAL | Status: DC | PRN
  Filled 2019-01-08: qty 1

## 2019-01-08 MED ORDER — OXYCODONE HCL 5 MG PO TABS
5.0000 mg | ORAL_TABLET | ORAL | Status: DC | PRN
  Administered 2019-01-08 – 2019-01-09 (×2): 5 mg via ORAL
  Filled 2019-01-08 (×2): qty 1

## 2019-01-08 MED ORDER — ACETAMINOPHEN 325 MG PO TABS
650.0000 mg | ORAL_TABLET | Freq: Four times a day (QID) | ORAL | Status: DC | PRN
  Administered 2019-01-09: 650 mg via ORAL
  Filled 2019-01-08: qty 2

## 2019-01-08 MED ORDER — MONTELUKAST SODIUM 10 MG PO TABS
10.0000 mg | ORAL_TABLET | Freq: Every day | ORAL | Status: DC
  Administered 2019-01-08: 22:00:00 10 mg via ORAL
  Filled 2019-01-08: qty 1

## 2019-01-08 MED ORDER — POTASSIUM CHLORIDE CRYS ER 20 MEQ PO TBCR
40.0000 meq | EXTENDED_RELEASE_TABLET | Freq: Once | ORAL | Status: AC
  Administered 2019-01-08: 18:00:00 40 meq via ORAL
  Filled 2019-01-08: qty 2

## 2019-01-08 MED ORDER — ACETAMINOPHEN 650 MG RE SUPP: 650.0000 mg | Freq: Four times a day (QID) | RECTAL | Status: DC | PRN

## 2019-01-08 MED ORDER — POTASSIUM CHLORIDE IN NACL 20-0.9 MEQ/L-% IV SOLN
INTRAVENOUS | Status: AC
  Administered 2019-01-08: 18:00:00 via INTRAVENOUS
  Filled 2019-01-08 (×2): qty 1000

## 2019-01-08 MED ORDER — MORPHINE SULFATE (PF) 4 MG/ML IV SOLN
4.0000 mg | Freq: Once | INTRAVENOUS | Status: AC
  Administered 2019-01-08: 4 mg via INTRAVENOUS
  Filled 2019-01-08: qty 1

## 2019-01-08 MED ORDER — HYDROMORPHONE HCL 1 MG/ML IJ SOLN: 1.0000 mg | INTRAMUSCULAR | Status: DC | PRN

## 2019-01-08 MED ORDER — ONDANSETRON HCL 4 MG/2ML IJ SOLN
4.0000 mg | Freq: Once | INTRAMUSCULAR | Status: AC
  Administered 2019-01-08: 13:00:00 4 mg via INTRAVENOUS
  Filled 2019-01-08: qty 2

## 2019-01-08 MED ORDER — LORATADINE 10 MG PO TABS
10.0000 mg | ORAL_TABLET | Freq: Every day | ORAL | Status: DC
  Administered 2019-01-08 – 2019-01-09 (×2): 10 mg via ORAL
  Filled 2019-01-08 (×2): qty 1

## 2019-01-08 MED ORDER — LACTATED RINGERS IV BOLUS
1000.0000 mL | Freq: Once | INTRAVENOUS | Status: AC
  Administered 2019-01-08: 11:00:00 1000 mL via INTRAVENOUS

## 2019-01-08 NOTE — ED Provider Notes (Signed)
[facility] Provider Note   CSN: [id number] Arrival date & time: 01/08/19  1046    History   Chief Complaint Chief Complaint  Patient presents with   Abdominal Pain   Back Pain    HPI [name] is a 33 y.o. female.     The history is provided by the patient.  Abdominal Pain  Pain location:  R flank Pain quality: aching   Pain radiates to:  RLQ Pain severity:  Moderate Onset quality:  Gradual Duration:  4 hours Timing:  Constant Progression:  Unchanged Chronicity:  New Context: not sick contacts   Relieved by:  Nothing Worsened by:  Nothing Associated symptoms: nausea   Associated symptoms: no chest pain, no chills, no constipation, no cough, no diarrhea, no dysuria, no fever, no hematuria, no shortness of breath, no sore throat, no vaginal bleeding, no vaginal discharge and no vomiting   Risk factors: has not had multiple surgeries     Past Medical History:  Diagnosis Date   Asthma    Bilateral leg numbness 05/24/2007   Bronchitis, asthmatic    DVT (deep venous thrombosis), right    FHx: cancer    FHx: diabetes mellitus    FHx: heart disease    FHx: kidney disease    FHx: migraine headaches    FHx: stroke    FHx: thyroid disease    H/O acute respiratory distress syndrome    With a history of septic shock   H/O dysmenorrhea 2005   H/O pyelonephritis    H/O septic shock 05/2006   H/O varicella    Hepatitis    vaccine x3   Hoarseness of voice    intermittent   Hx: UTI (urinary tract infection)    x 1   Low BMI 2007   During pregnancy   Postpartum care following vaginal delivery (3/28) 12/24/2013   Yeast infection     Patient Active Problem List   Diagnosis Date Noted   SAB (spontaneous abortion)--02/26/13 02/26/2013   Dysmenorrhea 02/13/2012   ALLERGIC RHINITIS 09/03/2009   Asthma 09/03/2009   DEEP VENOUS THROMBOPHLEBITIS, LEG, RIGHT 07/27/2007   ADULT RESPIRATORY DISTRESS  SYNDROME 07/27/2007    Past Surgical History:  Procedure Laterality Date   WISDOM TOOTH EXTRACTION     x 4     OB History    Gravida  3   Para  2   Term  2   Preterm      AB  1   Living  2     SAB  1   TAB  0   Ectopic  0   Multiple  0   Live Births  2            Home Medications    Prior to Admission medications   Medication Sig Start Date End Date Taking? Authorizing Provider  dicyclomine (BENTYL) 20 MG tablet Take up to 4x daily as needed for IBS symptoms 10/03/18   Copland, Gay Filler, MD  levocetirizine (XYZAL) 5 MG tablet Take 5 mg by mouth daily.    [provider]  levonorgestrel (MIRENA) 20 MCG/24HR IUD 1 each by Intrauterine route once.    [provider]  montelukast (SINGULAIR) 10 MG tablet Take 10 mg by mouth daily.    [provider]    Family History Family History  Problem Relation Age of Onset   Diabetes Maternal Grandmother    Heart disease Maternal Grandmother  heart attack   COPD Maternal Grandmother        bronchitis   Hypertension Father    Hyperlipidemia Father    Diabetes Father    Fibroids Mother        had hyst   Hypertension Mother    Hyperlipidemia Mother    Diabetes Mother    Fibroids Paternal Aunt    Mental retardation Paternal Aunt    Cancer Paternal Grandfather        brain tumor   Hypertension Maternal Grandfather    Stroke Maternal Grandfather    Kidney disease Maternal Grandfather        dialysis    Social History Social History   Tobacco Use   Smoking status: Never Smoker   Smokeless tobacco: Never Used  Substance Use Topics   Alcohol use: No   Drug use: No     Allergies   Amoxicillin and Lactose intolerance (gi)   Review of Systems Review of Systems  Constitutional: Negative for chills and fever.  HENT: Negative for ear pain and sore throat.   Eyes: Negative for pain and visual disturbance.  Respiratory: Negative for cough and  shortness of breath.   Cardiovascular: Negative for chest pain and palpitations.  Gastrointestinal: Positive for abdominal pain and nausea. Negative for anal bleeding, constipation, diarrhea and vomiting.  Genitourinary: Positive for flank pain. Negative for decreased urine volume, difficulty urinating, dyspareunia, dysuria, enuresis, frequency, genital sores, hematuria, menstrual problem, pelvic pain, urgency, vaginal bleeding and vaginal discharge.  Musculoskeletal: Negative for arthralgias and back pain.  Skin: Negative for color change and rash.  Neurological: Negative for seizures and syncope.  All other systems reviewed and are negative.    Physical Exam Updated Vital Signs  ED Triage Vitals  Enc Vitals Group     BP 01/08/19 1056 111/68     Pulse Rate 01/08/19 1056 90     Resp 01/08/19 1056 16     Temp 01/08/19 1056 (!) 96.7 F (35.9 C)     Temp Source 01/08/19 1056 Axillary     SpO2 01/08/19 1056 100 %     Weight --      Height --      Head Circumference --      Peak Flow --      Pain Score 01/08/19 1217 8     Pain Loc --      Pain Edu? --      Excl. in [place]? --      Physical Exam Vitals signs and nursing note reviewed.  Constitutional:      General: She is in acute distress.     Appearance: She is well-developed.  HENT:     Head: Normocephalic and atraumatic.  Eyes:     Extraocular Movements: Extraocular movements intact.     Conjunctiva/sclera: Conjunctivae normal.  Neck:     Musculoskeletal: Neck supple.  Cardiovascular:     Rate and Rhythm: Normal rate and regular rhythm.     Heart sounds: Normal heart sounds. No murmur.  Pulmonary:     Effort: Pulmonary effort is normal. No respiratory distress.     Breath sounds: Normal breath sounds.  Abdominal:     General: Abdomen is flat.     Palpations: Abdomen is soft.     Tenderness: There is abdominal tenderness in the right upper quadrant. There is right CVA tenderness. There is no left CVA tenderness,  guarding or rebound. Negative signs include Murphy's sign and Rovsing's sign.  Skin:  General: Skin is warm and dry.  Neurological:     Mental Status: She is alert.      ED Treatments / Results  Labs (all labs ordered are listed, but only abnormal results are displayed) Labs Reviewed  COMPREHENSIVE METABOLIC PANEL - Abnormal; Notable for the following components:      Result Value   Potassium 3.0 (*)    CO2 21 (*)    Glucose, Bld 163 (*)    BUN <5 (*)    All other components within normal limits  CBC WITH DIFFERENTIAL/PLATELET - Abnormal; Notable for the following components:   WBC 13.0 (*)    RBC 3.85 (*)    Hemoglobin 11.1 (*)    HCT 33.6 (*)    Neutro Abs 9.7 (*)    All other components within normal limits  URINALYSIS, ROUTINE W REFLEX MICROSCOPIC - Abnormal; Notable for the following components:   Ketones, ur 20 (*)    All other components within normal limits  URINE CULTURE  LIPASE, BLOOD  I-STAT BETA HCG BLOOD, ED ([facility], [facility], [facility] ONLY)    EKG None  Radiology Ct Abdomen Pelvis W Contrast  Result Date: 01/08/2019 CLINICAL DATA:  Right flank pain EXAM: CT ABDOMEN AND PELVIS WITH CONTRAST TECHNIQUE: Multidetector CT imaging of the abdomen and pelvis was performed using the standard protocol following bolus administration of intravenous contrast. CONTRAST:  185mL OMNIPAQUE IOHEXOL 300 MG/ML  SOLN COMPARISON:  03/05/2015 FINDINGS: Lower chest: No acute abnormality. Hepatobiliary: Unremarkable Pancreas: Unremarkable Spleen: Unremarkable Adrenals/Urinary Tract: Moderate bilateral hydronephrosis and hydroureter. Unremarkable adrenal glands. Bladder is decompressed. Stomach/Bowel: Small and large bowel are decompressed. Stomach is decompressed. No obvious mass in the colon. Vascular/Lymphatic: No evidence of aortic aneurysm. Reproductive: There is an ill-defined mass emanating from the cervix and extending superiorly and posteriorly between the cervix and rectum. It is also  inseparable from the serosa of the rectum. It measures up to 4.8 cm. The remainder of the uterus is within normal limits. IUD is in place. Other: No free fluid. Musculoskeletal: No vertebral compression deformity. IMPRESSION: Bilateral hydronephrosis and hydroureter are so seated with a pelvic mass is described above. The mass is interposed between the cervix and rectum and is inseparable from the 2 organs. Given the demographics, findings are most concerning for cervical carcinoma and bilateral ureteral obstruction. Electronically Signed   By: Marybelle Killings M.D.   On: 01/08/2019 13:46    Procedures Procedures (including critical care time)  Medications Ordered in ED Medications  lactated ringers bolus 1,000 mL (0 mLs Intravenous Stopped 01/08/19 1250)  ondansetron (ZOFRAN) injection 4 mg (4 mg Intravenous Given 01/08/19 1109)  morphine 4 MG/ML injection 4 mg (4 mg Intravenous Given 01/08/19 1111)  HYDROmorphone (DILAUDID) injection 1 mg (1 mg Intravenous Given 01/08/19 1241)  ondansetron (ZOFRAN) injection 4 mg (4 mg Intravenous Given 01/08/19 1241)  iohexol (OMNIPAQUE) 300 MG/ML solution 100 mL (100 mLs Intravenous Contrast Given 01/08/19 1311)     Initial Impression / Assessment and Plan / ED Course  I have reviewed the triage vital signs and the nursing notes.  Pertinent labs & imaging results that were available during my care of the patient were reviewed by me and considered in my medical decision making (see chart for details).     BRAELYN JENSON is a 33 year old female with history of asthma who presents to the ED with right flank pain.  Patient with normal vitals.  No fever.  Pain ongoing for the last several hours.  Denies any history of kidney stones.  No hematuria.  No vaginal discharge or vaginal bleeding.  No concern for STD.  Denies pregnancy.  Has had nausea but no vomiting.  States that yesterday she felt gassy but denies any diarrhea, constipation, history of abdominal surgeries.   Denies any urinary symptoms but has had bad kidney infection in the past.  Is mostly tender in the right CVA on exam.  Does have some right-sided abdominal tenderness as well but fairly mild.  She appears uncomfortable.  Concern for possible kidney stone versus gallbladder issue versus urinary infection.  Less likely PID or pelvic/ovarian pathology given location of pain.  Will start with basic labs including CT of the abdomen and pelvis.  Will get pregnancy test.  Patient given lactated Ringer bolus, IV Zofran, IV morphine.  Will reevaluate.  Lab work showed no significant anemia, electrolyte abnormality, kidney injury.  Urinalysis unremarkable.  CT scan concerning for pelvic mass likely is cervical carcinoma.  This mass extends between the cervix and the rectum.  Mass is causing bilateral hydronephrosis however patient is not having any difficulty urinating however she is having some urgency and frequency.  Patient continues to have pain despite multiple doses of IV narcotics.  Dr. Glo Herring with gynecology was consulted and will come down to the ED to evaluate the patient.  Patient to be admitted to hospitalist service for further pain control.  Gynecology oncology likely to be involved as patient likely has cervical cancer.  This chart was dictated using voice recognition software.  Despite best efforts to proofread,  errors can occur which can change the documentation meaning.    Final Clinical Impressions(s) / ED Diagnoses   Final diagnoses:  Pelvic mass  Intractable pain  Bilateral hydronephrosis    ED Discharge Orders    None       Lennice Sites, DO 01/08/19 1507

## 2019-01-08 NOTE — ED Notes (Signed)
ED TO INPATIENT HANDOFF REPORT  ED Nurse Name and Phone #: Joellen Jersey [phone]  S Name/Age/Gender [name] 33 y.o. female Room/Bed: 017C/017C  Code Status   Code Status: Prior  Home/SNF/Other Home Patient oriented to: self, place, time and situation Is this baseline? Yes   Triage Complete: Triage complete  Chief Complaint back and abd pain  Triage Note Pt arrives from home complaining of right flank pain that began this morning. Pt states she is not having issue with urination.   Allergies Allergies  Allergen Reactions  . Amoxicillin Hives    Did it involve swelling of the face/tongue/throat, SOB, or low BP? No Did it involve sudden or severe rash/hives, skin peeling, or any reaction on the inside of your mouth or nose? No Did you need to seek medical attention at a hospital or doctor's office? No When did it last happen?as a child If all above answers are "NO", may proceed with cephalosporin use.   . Lactose Intolerance (Gi)     Level of Care/Admitting Diagnosis ED Disposition    ED Disposition Condition Comment   Admit  Hospital Area: [facility] [[id number]]  Level of Care: Med-Surg [16]  Diagnosis: Abdominal mass [614431]  Admitting Physician: Sid Falcon [phone]  Attending Physician: Sid Falcon [[id number]  PT Class (Do Not Modify): Observation [104]  PT Acc Code (Do Not Modify): Observation [10022]       B Medical/Surgery History Past Medical History:  Diagnosis Date  . Asthma   . Bilateral leg numbness 05/24/2007  . Bronchitis, asthmatic   . DVT (deep venous thrombosis), right   . FHx: cancer   . FHx: diabetes mellitus   . FHx: heart disease   . FHx: kidney disease   . FHx: migraine headaches   . FHx: stroke   . FHx: thyroid disease   . H/O acute respiratory distress syndrome    With a history of septic shock  . H/O dysmenorrhea 2005  . H/O pyelonephritis   . H/O septic shock 05/2006  . H/O varicella   . Hepatitis     vaccine x3  . Hoarseness of voice    intermittent  . Hx: UTI (urinary tract infection)    x 1  . Low BMI 2007   During pregnancy  . Postpartum care following vaginal delivery (3/28) 12/24/2013  . Yeast infection    Past Surgical History:  Procedure Laterality Date  . PERCUTANEOUS NEPHROSTOMY     2007 due to pyelonephritis  . WISDOM TOOTH EXTRACTION     x 4     A IV Location/Drains/Wounds Patient Lines/Drains/Airways Status   Active Line/Drains/Airways    Name:   Placement date:   Placement time:   Site:   Days:   Peripheral IV 01/08/19 Right Antecubital   01/08/19    1106    Antecubital   less than 1          Intake/Output Last 24 hours  Intake/Output Summary (Last 24 hours) at 01/08/2019 1619 Last data filed at 01/08/2019 1250 Gross per 24 hour  Intake 1000 ml  Output -  Net 1000 ml    Labs/Imaging Results for orders placed or performed during the hospital encounter of 01/08/19 (from the past 48 hour(s))  Comprehensive metabolic panel     Status: Abnormal   Collection Time: 01/08/19 10:57 AM  Result Value Ref Range   Sodium 139 135 - 145 mmol/L   Potassium 3.0 (L) 3.5 - 5.1 mmol/L  Chloride 105 98 - 111 mmol/L   CO2 21 (L) 22 - 32 mmol/L   Glucose, Bld 163 (H) 70 - 99 mg/dL   BUN <5 (L) 6 - 20 mg/dL   Creatinine, Ser 0.52 0.44 - 1.00 mg/dL   Calcium 9.0 8.9 - 10.3 mg/dL   Total Protein 6.7 6.5 - 8.1 g/dL   Albumin 4.2 3.5 - 5.0 g/dL   AST 19 15 - 41 U/L   ALT 11 0 - 44 U/L   Alkaline Phosphatase 54 38 - 126 U/L   Total Bilirubin 0.7 0.3 - 1.2 mg/dL   GFR calc non Af Amer >60 >60 mL/min   GFR calc Af Amer >60 >60 mL/min   Anion gap 13 5 - 15    Comment: Performed at [facility], [place] [address], [place], [place] [address]  Lipase, blood     Status: None   Collection Time: 01/08/19 10:57 AM  Result Value Ref Range   Lipase 18 11 - 51 U/L    Comment: Performed at [facility] [address], [place], [place] [address]  CBC with Diff      Status: Abnormal   Collection Time: 01/08/19 10:57 AM  Result Value Ref Range   WBC 13.0 (H) 4.0 - 10.5 K/uL   RBC 3.85 (L) 3.87 - 5.11 MIL/uL   Hemoglobin 11.1 (L) 12.0 - 15.0 g/dL   HCT 33.6 (L) 36.0 - 46.0 %   MCV 87.3 80.0 - 100.0 fL   MCH 28.8 26.0 - 34.0 pg   MCHC 33.0 30.0 - 36.0 g/dL   RDW 12.7 11.5 - 15.5 %   Platelets 235 150 - 400 K/uL   nRBC 0.0 0.0 - 0.2 %   Neutrophils Relative % 74 %   Neutro Abs 9.7 (H) 1.7 - 7.7 K/uL   Lymphocytes Relative 21 %   Lymphs Abs 2.7 0.7 - 4.0 K/uL   Monocytes Relative 4 %   Monocytes Absolute 0.5 0.1 - 1.0 K/uL   Eosinophils Relative 1 %   Eosinophils Absolute 0.1 0.0 - 0.5 K/uL   Basophils Relative 0 %   Basophils Absolute 0.0 0.0 - 0.1 K/uL   Immature Granulocytes 0 %   Abs Immature Granulocytes 0.03 0.00 - 0.07 K/uL    Comment: Performed at [facility], [place] [address], [place], [place] [address]  I-Stat beta hCG blood, ED     Status: None   Collection Time: 01/08/19 11:18 AM  Result Value Ref Range   I-stat hCG, quantitative <5.0 <5 mIU/mL   Comment 3            Comment:   GEST. AGE      CONC.  (mIU/mL)   <=1 WEEK        5 - 50     2 WEEKS       50 - 500     3 WEEKS       100 - 10,000     4 WEEKS     1,000 - 30,000        FEMALE AND NON-PREGNANT FEMALE:     LESS THAN 5 mIU/mL   Urinalysis, Routine w reflex microscopic     Status: Abnormal   Collection Time: 01/08/19 12:46 PM  Result Value Ref Range   Color, Urine YELLOW YELLOW   APPearance CLEAR CLEAR   Specific Gravity, Urine 1.009 1.005 - 1.030   pH 6.0 5.0 - 8.0   Glucose,  UA NEGATIVE NEGATIVE mg/dL   Hgb urine dipstick NEGATIVE NEGATIVE   Bilirubin Urine NEGATIVE NEGATIVE   Ketones, ur 20 (A) NEGATIVE mg/dL   Protein, ur NEGATIVE NEGATIVE mg/dL   Nitrite NEGATIVE NEGATIVE   Leukocytes,Ua NEGATIVE NEGATIVE    Comment: Performed at [facility] [address] [place], [place] [address]   Ct Abdomen Pelvis W Contrast  Result Date:  01/08/2019 CLINICAL DATA:  Right flank pain EXAM: CT ABDOMEN AND PELVIS WITH CONTRAST TECHNIQUE: Multidetector CT imaging of the abdomen and pelvis was performed using the standard protocol following bolus administration of intravenous contrast. CONTRAST:  160mL OMNIPAQUE IOHEXOL 300 MG/ML  SOLN COMPARISON:  03/05/2015 FINDINGS: Lower chest: No acute abnormality. Hepatobiliary: Unremarkable Pancreas: Unremarkable Spleen: Unremarkable Adrenals/Urinary Tract: Moderate bilateral hydronephrosis and hydroureter. Unremarkable adrenal glands. Bladder is decompressed. Stomach/Bowel: Small and large bowel are decompressed. Stomach is decompressed. No obvious mass in the colon. Vascular/Lymphatic: No evidence of aortic aneurysm. Reproductive: There is an ill-defined mass emanating from the cervix and extending superiorly and posteriorly between the cervix and rectum. It is also inseparable from the serosa of the rectum. It measures up to 4.8 cm. The remainder of the uterus is within normal limits. IUD is in place. Other: No free fluid. Musculoskeletal: No vertebral compression deformity. IMPRESSION: Bilateral hydronephrosis and hydroureter are so seated with a pelvic mass is described above. The mass is interposed between the cervix and rectum and is inseparable from the 2 organs. Given the demographics, findings are most concerning for cervical carcinoma and bilateral ureteral obstruction. Electronically Signed   By: Marybelle Killings M.D.   On: 01/08/2019 13:46    Pending Labs Unresulted Labs (From admission, onward)    Start     Ordered   01/08/19 1618  Wet prep, genital  Once,   STAT     01/08/19 1617   01/08/19 1057  Urine culture  ONCE - STAT,   STAT     01/08/19 1057   Signed and Held  HIV antibody (Routine Testing)  Once,   R     Signed and Held   Signed and Held  Basic metabolic panel  Once,   R     Signed and Held   Signed and Held  Basic metabolic panel  Tomorrow morning,   R     Signed and Held   Signed  and Held  CBC  Tomorrow morning,   R     Signed and Held          Vitals/Pain Today's Vitals   01/08/19 1100 01/08/19 1217 01/08/19 1443 01/08/19 1504  BP: 118/76  122/86   Pulse: 86  96   Resp:   16   Temp:      TempSrc:      SpO2: 100%  100%   PainSc:  8   5     Isolation Precautions No active isolations  Medications Medications  lactated ringers bolus 1,000 mL (0 mLs Intravenous Stopped 01/08/19 1250)  ondansetron (ZOFRAN) injection 4 mg (4 mg Intravenous Given 01/08/19 1109)  morphine 4 MG/ML injection 4 mg (4 mg Intravenous Given 01/08/19 1111)  HYDROmorphone (DILAUDID) injection 1 mg (1 mg Intravenous Given 01/08/19 1241)  ondansetron (ZOFRAN) injection 4 mg (4 mg Intravenous Given 01/08/19 1241)  iohexol (OMNIPAQUE) 300 MG/ML solution 100 mL (100 mLs Intravenous Contrast Given 01/08/19 1311)    Mobility walks Low fall risk    R Recommendations: See Admitting Provider Note  Report given to:  Additional Notes:

## 2019-01-08 NOTE — ED Triage Notes (Signed)
Pt arrives from home complaining of right flank pain that began this morning. Pt states she is not having issue with urination.

## 2019-01-08 NOTE — ED Notes (Signed)
Pt ambulated to bathroom without assistance for urine sample collection

## 2019-01-08 NOTE — Progress Notes (Signed)
Noted pink tinge to urine- pt concerned in bleeding- did not see rbc's in urine specimen- she states just finished her menses last week

## 2019-01-08 NOTE — ED Notes (Signed)
Patient transported to CT 

## 2019-01-08 NOTE — H&P (Addendum)
History and Physical    JENASCIA BUMPASS TFT:[id number] DOB: 01-30-1986 DOA: 01/08/2019  PCP: Darreld Mclean, MD Patient coming from: Home  Chief Complaint: back/abdominal pain  HPI: [name] is a 33 y.o. female with medical history significant of IBS, h/o DVT, allergies who presents for right sided back pain, abdominal pain.  She notes that the pain started a few hours prior to admission.  She thought she might be having IBS symptoms and tried bentyl, but this did not help.  She reports that dilaudid helped, but morphine did not here in the ED.  She has been having a small amount of bleeding earlier this week, but she thought it was due to recent intercourse and possibly the end of her cycle.  Her LMP started on 12/29/18 and was improving at this time.  She has a Mirena in for birth control and this is due to come out soon.  She sees Dr. Garwin Brothers for OB/Gyn, and she gets a yearly pelvic exam.  She thinks the last one was Sept/Oct of 2019.  She has not had any dyspareunia.  She has had mild right abdominal swelling, but this is very mild.  She notes normal urine appearance and she feels that she has been peeing regularly and actually at an increased amount recently.  No fever, chills, dysuria, nausea, vomiting, weight loss noted.  Pain was an 8 on admission and decreased to a 5 after dilaudid administration.    ED Course: In the ED, she was noted to have a low K, normal Cr at 0.52, WBC of 13, H/H of 11 and 33 with a normal MCV.  CT abdomen revealed a mass originating from the cervix moving superiorly and posteriorly.  Inseparable from the rectum.  4.8 CM.  She further had bilateral hydronephrosis and hydroureter.    Review of Systems: As per HPI otherwise 10 point review of systems negative.   Past Medical History:  Diagnosis Date   Asthma    Bilateral leg numbness 05/24/2007   Bronchitis, asthmatic    DVT (deep venous thrombosis), right    FHx: cancer    FHx: diabetes mellitus     FHx: heart disease    FHx: kidney disease    FHx: migraine headaches    FHx: stroke    FHx: thyroid disease    H/O acute respiratory distress syndrome    With a history of septic shock   H/O dysmenorrhea 2005   H/O pyelonephritis    H/O septic shock 05/2006   H/O varicella    Hepatitis    vaccine x3   Hoarseness of voice    intermittent   Hx: UTI (urinary tract infection)    x 1   Low BMI 2007   During pregnancy   Postpartum care following vaginal delivery (3/28) 12/24/2013   Yeast infection     Past Surgical History:  Procedure Laterality Date   PERCUTANEOUS NEPHROSTOMY     2007 due to pyelonephritis   WISDOM TOOTH EXTRACTION     x 4   Reviewed with the patient  reports that she has never smoked. She has never used smokeless tobacco. She reports that she does not drink alcohol or use drugs.  Allergies  Allergen Reactions   Amoxicillin Hives    Did it involve swelling of the face/tongue/throat, SOB, or low BP? No Did it involve sudden or severe rash/hives, skin peeling, or any reaction on the inside of your mouth or nose? No Did you need  to seek medical attention at a hospital or doctor's office? No When did it last happen?as a child If all above answers are NO, may proceed with cephalosporin use.    Lactose Intolerance (Gi)    Reviewed Family History  Problem Relation Age of Onset   Diabetes Maternal Grandmother    Heart disease Maternal Grandmother        heart attack   COPD Maternal Grandmother        bronchitis   Hypertension Father    Hyperlipidemia Father    Diabetes Father    Fibroids Mother        had hyst   Hypertension Mother    Hyperlipidemia Mother    Diabetes Mother    Fibroids Paternal Aunt    Mental retardation Paternal Aunt    Cancer Paternal Grandfather        brain tumor   Hypertension Maternal Grandfather    Stroke Maternal Grandfather    Kidney disease Maternal Grandfather        dialysis       Prior to Admission medications   Medication Sig Start Date End Date Taking? Authorizing Provider  dicyclomine (BENTYL) 20 MG tablet Take up to 4x daily as needed for IBS symptoms 10/03/18   Copland, Gay Filler, MD  levocetirizine (XYZAL) 5 MG tablet Take 5 mg by mouth daily.    [provider]  levonorgestrel (MIRENA) 20 MCG/24HR IUD 1 each by Intrauterine route once.    [provider]  montelukast (SINGULAIR) 10 MG tablet Take 10 mg by mouth daily.    [provider]    Physical Exam:  Constitutional: NAD, calm, comfortable Vitals:   01/08/19 1056 01/08/19 1100 01/08/19 1443  BP: 111/68 118/76 122/86  Pulse: 90 86 96  Resp: 16  16  Temp: (!) 96.7 F (35.9 C)    TempSrc: Axillary    SpO2: 100% 100% 100%   Eyes:  lids and conjunctivae normal, anicteric sclerae ENMT: Mucous membranes are moist. Normal dentition.  Respiratory: CTAB, no wheezing, no rales Back: + CVA tenderness on the right Cardiovascular: RR, NR, no murmur noted Abdomen: +BS, + ttp over the right quadrants Musculoskeletal: no clubbing / cyanosis. Normal muscle tone.  Skin: no rashes, lesions, ulcers on exposed skin Neurologic: CN 2-12 grossly intact. Strength 5/5 in all 4.  Psychiatric: Normal judgment and insight. Alert and oriented x 3. Normal mood.    Labs on Admission: I have personally reviewed following labs and imaging studies  CBC: Recent Labs  Lab 01/08/19 1057  WBC 13.0*  NEUTROABS 9.7*  HGB 11.1*  HCT 33.6*  MCV 87.3  PLT 532   Basic Metabolic Panel: Recent Labs  Lab 01/08/19 1057  NA 139  K 3.0*  CL 105  CO2 21*  GLUCOSE 163*  BUN <5*  CREATININE 0.52  CALCIUM 9.0   GFR: CrCl cannot be calculated (Unknown ideal weight.). Liver Function Tests: Recent Labs  Lab 01/08/19 1057  AST 19  ALT 11  ALKPHOS 54  BILITOT 0.7  PROT 6.7  ALBUMIN 4.2   Recent Labs  Lab 01/08/19 1057  LIPASE 18   No results for input(s): AMMONIA in the last 168  hours. Coagulation Profile: No results for input(s): INR, PROTIME in the last 168 hours. Cardiac Enzymes: No results for input(s): CKTOTAL, CKMB, CKMBINDEX, TROPONINI in the last 168 hours. BNP (last 3 results) No results for input(s): PROBNP in the last 8760 hours. HbA1C: No results for input(s): HGBA1C in the  last 72 hours. CBG: No results for input(s): GLUCAP in the last 168 hours. Lipid Profile: No results for input(s): CHOL, HDL, LDLCALC, TRIG, CHOLHDL, LDLDIRECT in the last 72 hours. Thyroid Function Tests: No results for input(s): TSH, T4TOTAL, FREET4, T3FREE, THYROIDAB in the last 72 hours. Anemia Panel: No results for input(s): VITAMINB12, FOLATE, FERRITIN, TIBC, IRON, RETICCTPCT in the last 72 hours. Urine analysis:    Component Value Date/Time   COLORURINE YELLOW 01/08/2019 [place] 01/08/2019 1246   LABSPEC 1.009 01/08/2019 1246   PHURINE 6.0 01/08/2019 1246   GLUCOSEU NEGATIVE 01/08/2019 1246   HGBUR NEGATIVE 01/08/2019 1246   BILIRUBINUR NEGATIVE 01/08/2019 1246   BILIRUBINUR neg 08/10/2017 1433   KETONESUR 20 (A) 01/08/2019 1246   PROTEINUR NEGATIVE 01/08/2019 1246   UROBILINOGEN 0.2 08/10/2017 1433   UROBILINOGEN 0.2 12/15/2013 0915   NITRITE NEGATIVE 01/08/2019 1246   LEUKOCYTESUR NEGATIVE 01/08/2019 1246    Radiological Exams on Admission: Ct Abdomen Pelvis W Contrast  Result Date: 01/08/2019 CLINICAL DATA:  Right flank pain EXAM: CT ABDOMEN AND PELVIS WITH CONTRAST TECHNIQUE: Multidetector CT imaging of the abdomen and pelvis was performed using the standard protocol following bolus administration of intravenous contrast. CONTRAST:  167mL OMNIPAQUE IOHEXOL 300 MG/ML  SOLN COMPARISON:  03/05/2015 FINDINGS: Lower chest: No acute abnormality. Hepatobiliary: Unremarkable Pancreas: Unremarkable Spleen: Unremarkable Adrenals/Urinary Tract: Moderate bilateral hydronephrosis and hydroureter. Unremarkable adrenal glands. Bladder is decompressed.  Stomach/Bowel: Small and large bowel are decompressed. Stomach is decompressed. No obvious mass in the colon. Vascular/Lymphatic: No evidence of aortic aneurysm. Reproductive: There is an ill-defined mass emanating from the cervix and extending superiorly and posteriorly between the cervix and rectum. It is also inseparable from the serosa of the rectum. It measures up to 4.8 cm. The remainder of the uterus is within normal limits. IUD is in place. Other: No free fluid. Musculoskeletal: No vertebral compression deformity. IMPRESSION: Bilateral hydronephrosis and hydroureter are so seated with a pelvic mass is described above. The mass is interposed between the cervix and rectum and is inseparable from the 2 organs. Given the demographics, findings are most concerning for cervical carcinoma and bilateral ureteral obstruction. Electronically Signed   By: Marybelle Killings M.D.   On: 01/08/2019 13:46     Assessment/Plan  Abdominal mass - Gynecology evaluation in the ED, follow up recommendations - May need biopsy and/or gyn/onc follow up - Pain control with dilaudid and oxycodone - Transition to oral medications tomorrow  Bilateral hydronephrosis - Strict ins and outs - Recheck renal function in the morning  Hypokalemia - Replace orally, repeat potassium this afternoon was up to 3.6  IBS - Continue PRN bentyl  Allergies - Continue home xyzal and singulair.    DVT prophylaxis: SCDs  Code Status: Full    Disposition Plan: Admit for pain control  Consults called: Gynecology, Dr. Glo Herring  Admission status: Obs, Med surg    Gilles Chiquito MD [facility]   If 7PM-7AM, please contact night-coverage [email or web address]   01/08/2019, 3:37 PM

## 2019-01-09 DIAGNOSIS — N809 Endometriosis, unspecified: Secondary | ICD-10-CM | POA: Diagnosis present

## 2019-01-09 DIAGNOSIS — R52 Pain, unspecified: Secondary | ICD-10-CM | POA: Diagnosis not present

## 2019-01-09 DIAGNOSIS — N134 Hydroureter: Secondary | ICD-10-CM | POA: Diagnosis present

## 2019-01-09 LAB — BASIC METABOLIC PANEL
Anion gap: 7 (ref 5–15)
BUN: <5 mg/dL — ABNORMAL LOW (ref 6–20)
CO2: 23 mmol/L (ref 22–32)
Calcium: 8.7 mg/dL — ABNORMAL LOW (ref 8.9–10.3)
Chloride: 109 mmol/L (ref 98–111)
Creatinine, Ser: 0.45 mg/dL (ref 0.44–1.00)
GFR calc Af Amer: >60 mL/min (ref >60)
GFR calc non Af Amer: >60 mL/min (ref >60)
Glucose, Bld: 89 mg/dL (ref 70–99)
Potassium: 3.7 mmol/L (ref 3.5–5.1)
Sodium: 139 mmol/L (ref 135–145)

## 2019-01-09 LAB — CBC
HCT: 31 % — ABNORMAL LOW (ref 36.0–46.0)
Hemoglobin: 10.6 g/dL — ABNORMAL LOW (ref 12.0–15.0)
MCH: 29.2 pg (ref 26.0–34.0)
MCHC: 34.2 g/dL (ref 30.0–36.0)
MCV: 85.4 fL (ref 80.0–100.0)
Platelets: 214 10*3/uL (ref 150–400)
RBC: 3.63 MIL/uL — ABNORMAL LOW (ref 3.87–5.11)
RDW: 12.8 % (ref 11.5–15.5)
WBC: 11.1 10*3/uL — ABNORMAL HIGH (ref 4.0–10.5)
nRBC: 0 % (ref 0.0–0.2)

## 2019-01-09 LAB — HIV ANTIBODY (ROUTINE TESTING W REFLEX): HIV Screen 4th Generation wRfx: NONREACTIVE

## 2019-01-09 LAB — URINE CULTURE: Culture: <10000 — AB

## 2019-01-09 MED ORDER — OXYCODONE HCL 5 MG PO TABS: 5.0000 mg | ORAL_TABLET | ORAL | 0 refills | Status: DC | PRN

## 2019-01-09 NOTE — Progress Notes (Signed)
Patient discharged to home with instructions and prescription. 

## 2019-01-09 NOTE — Discharge Summary (Addendum)
Physician Discharge Summary  [name]:[id number] DOB: 24-Dec-1985 DOA: 01/08/2019  PCP: Darreld Mclean, MD  Admit date: 01/08/2019 Discharge date: 01/09/2019  Time spent: 45 minutes  Recommendations for Outpatient Follow-up:  1. Follow up with primary OB/Gyn dr cousins 2. BMP 1 week (patient to monitor urine output/flank pain)-- will return to ER/call PCP if occurs    Discharge Diagnoses:  Principal Problem:   Intractable pain Active Problems:   Abdominal mass   Bilateral hydronephrosis   Endometriosis   Hydroureter   Discharge Condition: stable  Diet recommendation: regular  There were no vitals filed for this visit.  History of present illness:  33 yo hx IBS, h/o DVT, allergies presented 4/12 with right sided abdominal pain. She was concerned about IBS but Bentyl did no help. Denies n/v/diarrhea. No fever chills headache. No dysuria/hematuria. Workup included CT abdomen revealing mass originating from the cervix moving superiorly and posteriorly as well as bilateral hydronephrosis and hydroureter  Hospital Course:  Abdominal mass/intractable pain/endometriosis. Pain much improved this am with po analgesia. Tolerating food/fluid. Evaluated by dr Glo Herring who opined Current admission is for pain control, once that is adequately addressed, care can be addressed as outpatient. Dr. Garwin Brothers (primary OB/Gyn) asks that patient contact the office and schedule prompt early f/u. Per dr Glo Herring Patient will likely need medical suppression of endometriosis with probable  LUPRON supressive therapy, and given severity of Disease, Dr Garwin Brothers stated referral to [facility] for any planned surgical treatments likely. Spoke with Dr. Garwin Brothers office who will be contacting patient for apt/US.  Pt is at risk of recurrent ascending infection if she gets uti. Will follow urine culture. Will discharge with pain medication.   Bilateral hydronephrosis/hydroureter. Per CT. Kidney function within limits  of normal. No dysuria. See #1. Instructed patient to return if develops fever, chills, dysuria, hematuria, flank pain. WBC trending down. At discharge she is afebrile and non-toxic appearing.   Hypokalemia. Replaced and resolved.   IBS stable at baseline.   Allergies stable  Procedures:    Consultations:  Dr. Glo Herring OB/Gyn  Discharge Exam: Vitals:   01/08/19 2206 01/09/19 0438  BP: 101/62 104/77  Pulse: (!) 111 (!) 103  Resp: 16 14  Temp: 98.8 F (37.1 C) 98.9 F (37.2 C)  SpO2: 98% 100%    General: awake alert sitting up in bed eating breakfast. No acute distress Cardiovascular: rrr no mgr no LE edema Respiratory: normal effort BS clear bilaterally no wheeze Abd: non-distended soft +BS non-tender to palpation  Discharge Instructions    Allergies as of 01/09/2019      Reactions   Amoxicillin Hives   Did it involve swelling of the face/tongue/throat, SOB, or low BP? No Did it involve sudden or severe rash/hives, skin peeling, or any reaction on the inside of your mouth or nose? No Did you need to seek medical attention at a hospital or doctor's office? No When did it last happen?as a child If all above answers are "NO", may proceed with cephalosporin use.   Lactose Intolerance (gi)       Medication List    TAKE these medications   dicyclomine 20 MG tablet Commonly known as:  BENTYL Take up to 4x daily as needed for IBS symptoms What changed:    how much to take  how to take this  when to take this  reasons to take this  additional instructions   levocetirizine 5 MG tablet Commonly known as:  XYZAL Take 5 mg by mouth  daily.   levonorgestrel 20 MCG/24HR IUD Commonly known as:  MIRENA 1 each by Intrauterine route once.   montelukast 10 MG tablet Commonly known as:  SINGULAIR Take 10 mg by mouth daily.   oxyCODONE 5 MG immediate release tablet Commonly known as:  Oxy IR/ROXICODONE Take 1 tablet (5 mg total) by mouth every 4 (four)  hours as needed for moderate pain.      Allergies  Allergen Reactions  . Amoxicillin Hives    Did it involve swelling of the face/tongue/throat, SOB, or low BP? No Did it involve sudden or severe rash/hives, skin peeling, or any reaction on the inside of your mouth or nose? No Did you need to seek medical attention at a hospital or doctor's office? No When did it last happen?as a child If all above answers are "NO", may proceed with cephalosporin use.   . Lactose Intolerance (Gi)       The results of significant diagnostics from this hospitalization (including imaging, microbiology, ancillary and laboratory) are listed below for reference.    Significant Diagnostic Studies: Ct Abdomen Pelvis W Contrast  Result Date: 01/08/2019 CLINICAL DATA:  Right flank pain EXAM: CT ABDOMEN AND PELVIS WITH CONTRAST TECHNIQUE: Multidetector CT imaging of the abdomen and pelvis was performed using the standard protocol following bolus administration of intravenous contrast. CONTRAST:  137mL OMNIPAQUE IOHEXOL 300 MG/ML  SOLN COMPARISON:  03/05/2015 FINDINGS: Lower chest: No acute abnormality. Hepatobiliary: Unremarkable Pancreas: Unremarkable Spleen: Unremarkable Adrenals/Urinary Tract: Moderate bilateral hydronephrosis and hydroureter. Unremarkable adrenal glands. Bladder is decompressed. Stomach/Bowel: Small and large bowel are decompressed. Stomach is decompressed. No obvious mass in the colon. Vascular/Lymphatic: No evidence of aortic aneurysm. Reproductive: There is an ill-defined mass emanating from the cervix and extending superiorly and posteriorly between the cervix and rectum. It is also inseparable from the serosa of the rectum. It measures up to 4.8 cm. The remainder of the uterus is within normal limits. IUD is in place. Other: No free fluid. Musculoskeletal: No vertebral compression deformity. IMPRESSION: Bilateral hydronephrosis and hydroureter are so seated with a pelvic mass is described  above. The mass is interposed between the cervix and rectum and is inseparable from the 2 organs. Given the demographics, findings are most concerning for cervical carcinoma and bilateral ureteral obstruction. Electronically Signed   By: Marybelle Killings M.D.   On: 01/08/2019 13:46    Microbiology: Recent Results (from the past 240 hour(s))  Wet prep, genital     Status: Abnormal   Collection Time: 01/08/19  4:18 PM  Result Value Ref Range Status   Yeast Wet Prep HPF POC NONE SEEN NONE SEEN Final   Trich, Wet Prep NONE SEEN NONE SEEN Final   Clue Cells Wet Prep HPF POC NONE SEEN NONE SEEN Final   WBC, Wet Prep HPF POC MANY (A) NONE SEEN Final   Sperm PRESENT  Final    Comment: Performed at [facility], [address] [place], [place] [address]     Labs: Basic Metabolic Panel: Recent Labs  Lab 01/08/19 1057 01/08/19 1725 01/09/19 0510  NA 139 140 139  K 3.0* 3.6 3.7  CL 105 107 109  CO2 21* 23 23  GLUCOSE 163* 133* 89  BUN <5* 7 <5*  CREATININE 0.52 0.59 0.45  CALCIUM 9.0 9.2 8.7*   Liver Function Tests: Recent Labs  Lab 01/08/19 1057  AST 19  ALT 11  ALKPHOS 54  BILITOT 0.7  PROT 6.7  ALBUMIN 4.2   Recent  Labs  Lab 01/08/19 1057  LIPASE 18   No results for input(s): AMMONIA in the last 168 hours. CBC: Recent Labs  Lab 01/08/19 1057 01/09/19 0510  WBC 13.0* 11.1*  NEUTROABS 9.7*  --   HGB 11.1* 10.6*  HCT 33.6* 31.0*  MCV 87.3 85.4  PLT 235 214   Cardiac Enzymes: No results for input(s): CKTOTAL, CKMB, CKMBINDEX, TROPONINI in the last 168 hours. BNP: BNP (last 3 results) No results for input(s): BNP in the last 8760 hours.  ProBNP (last 3 results) No results for input(s): PROBNP in the last 8760 hours.  CBG: No results for input(s): GLUCAP in the last 168 hours.     Signed:  Radene Gunning NP [facility] 01/09/2019, 10:11 AM   Patient was seen, examined,treatment plan was discussed with the Advance Practice Provider.  I have  personally reviewed the clinical findings, labs, EKG, imaging studies and management of this patient in detail. I have also reviewed the orders written for this patient which were under my direction. I agree with the documentation, as recorded by the Advance Practice Provider.   [name] is a 33 y.o. female here for pain control for mass near cervix.  Pain improved.  Work up in progress-- thought to be due to endometriosis. Patient to follow up with primary OBGYN.  Will need close monitoring of her kidney function and urine output.  Geradine Girt, DO  How to contact the [facility] Attending or Consulting provider [place] or covering provider during after hours [place], for this patient?  1. Check the care team in [facility] and look for a) attending/consulting [facility] provider listed and b) the [facility] team listed 2. Log into [email or web address] and use [place] universal password to access. If you do not have the password, please contact the hospital operator. 3. Locate the [facility] provider you are looking for under [facility] and page to a number that you can be directly reached. 4. If you still have difficulty reaching the provider, please page the [facility] (Director on Call) for the Hospitalists listed on amion for assistance.

## 2019-01-09 NOTE — Consult Note (Signed)
Reason for Consult:Cervical Mass, Hydronephrosis.Pelvic Pain,  Referring Physician:ED physician  [name] is an 33 y.o. female, who Presents to [facility] ED on 01/08/2019 with a short episode of right sided back pain and lower abdominal pain. She is followed by Dr Garwin Brothers for Gyn concerns. Those notes are not incorporated in Epic and not currently available for my review. Patient describes having been told in the past she might have endometriosis, and there has been some discussion of evaluating this by laparoscopy, but patient has been reluctant to do so.    Pertinent Gynecological History: Menses: flow is light and not regular due to IUD Mirena, which is approaching time for 5-yr replacement.  Bleeding: occasional breakthru bleeding on Mirena Contraception: IUD DES exposure: unknown Blood transfusions: none noted Sexually transmitted diseases: no past history Previous GYN Procedures:   Last mammogram: n/a Last pap: not in EPIC,  pt reports regular gyn care thru Dr Garwin Brothers Date: pt thinks was last seen 2019. Has May appt for IUD replacement at 5 yr use OB History:   [id number]   Menstrual History: Menarche age:  No LMP recorded. (Menstrual status: IUD).    Past Medical History:  Diagnosis Date  . Asthma   . Bilateral leg numbness 05/24/2007  . Bronchitis, asthmatic   . DVT (deep venous thrombosis), right   . FHx: cancer   . FHx: diabetes mellitus   . FHx: heart disease   . FHx: kidney disease   . FHx: migraine headaches   . FHx: stroke   . FHx: thyroid disease   . H/O acute respiratory distress syndrome    With a history of septic shock  . H/O dysmenorrhea 2005  . H/O pyelonephritis   . H/O septic shock 05/2006  . H/O varicella   . Hepatitis    vaccine x3  . Hoarseness of voice    intermittent  . Hx: UTI (urinary tract infection)    x 1  . Low BMI 2007   During pregnancy  . Postpartum care following vaginal delivery (3/28) 12/24/2013  . Yeast infection     Past  Surgical History:  Procedure Laterality Date  . PERCUTANEOUS NEPHROSTOMY     2007 due to pyelonephritis  . WISDOM TOOTH EXTRACTION     x 4    Family History  Problem Relation Age of Onset  . Diabetes Maternal Grandmother   . Heart disease Maternal Grandmother        heart attack  . COPD Maternal Grandmother        bronchitis  . Hypertension Father   . Hyperlipidemia Father   . Diabetes Father   . Fibroids Mother        had hyst  . Hypertension Mother   . Hyperlipidemia Mother   . Diabetes Mother   . Fibroids Paternal Aunt   . Mental retardation Paternal Aunt   . Cancer Paternal Grandfather        brain tumor  . Hypertension Maternal Grandfather   . Stroke Maternal Grandfather   . Kidney disease Maternal Grandfather        dialysis    Social History:  reports that she has never smoked. She has never used smokeless tobacco. She reports that she does not drink alcohol or use drugs.  Allergies:  Allergies  Allergen Reactions  . Amoxicillin Hives    Did it involve swelling of the face/tongue/throat, SOB, or low BP? No Did it involve sudden or severe rash/hives, skin peeling, or any reaction on  the inside of your mouth or nose? No Did you need to seek medical attention at a hospital or doctor's office? No When did it last happen?as a child If all above answers are "NO", may proceed with cephalosporin use.   . Lactose Intolerance (Gi)     Medications: I have reviewed the patient's current medications.  ROS   Denies weight loss , weight gain. Normal appetite.    Blood pressure 104/77, pulse (!) 103, temperature 98.9 F (37.2 C), temperature source Oral, resp. rate 14, SpO2 100 %, unknown if currently breastfeeding. Physical Exam  Constitutional: She is oriented to person, place, and time. She appears well-developed and well-nourished.  HENT:  Head: Normocephalic.  Eyes: Pupils are equal, round, and reactive to light.  Neck: Normal range of motion.   Respiratory: Effort normal.  GI: Soft. She exhibits no distension and no mass. There is no abdominal tenderness. There is no rebound and no guarding.  Genitourinary:    Vaginal discharge present.     Genitourinary Comments: EFG normal female anatomy, good support  Speculum exam reveal a large multiparous cervix with generous clear mucoid d/c , no lesions seen, no descensus. Wet PREP: few wbc, no trich,  GC and CHL collected, pending (multiple negatives over last few years.) Bimanual: No Cervical motion tenderness. IUD string not seen but IUD visible in uterus on CT                 Cervical tissues normal tissue texture                 Cul-de-sac thickened, stiff , inflexible , with dense nodularity of both uterosacral ligaments, particularly on pt right. Tender to firm palpation                 Uterus anteflexed, enlarged 2x normal size.                Adnexa no masses felt. nontender     Musculoskeletal: Normal range of motion.  Neurological: She is alert and oriented to person, place, and time. She has normal reflexes.  Skin: Skin is warm and dry.  Psychiatric: She has a normal mood and affect. Her behavior is normal. Judgment and thought content normal.    Results for orders placed or performed during the hospital encounter of 01/08/19 (from the past 48 hour(s))  Comprehensive metabolic panel     Status: Abnormal   Collection Time: 01/08/19 10:57 AM  Result Value Ref Range   Sodium 139 135 - 145 mmol/L   Potassium 3.0 (L) 3.5 - 5.1 mmol/L   Chloride 105 98 - 111 mmol/L   CO2 21 (L) 22 - 32 mmol/L   Glucose, Bld 163 (H) 70 - 99 mg/dL   BUN <5 (L) 6 - 20 mg/dL   Creatinine, Ser 0.52 0.44 - 1.00 mg/dL   Calcium 9.0 8.9 - 10.3 mg/dL   Total Protein 6.7 6.5 - 8.1 g/dL   Albumin 4.2 3.5 - 5.0 g/dL   AST 19 15 - 41 U/L   ALT 11 0 - 44 U/L   Alkaline Phosphatase 54 38 - 126 U/L   Total Bilirubin 0.7 0.3 - 1.2 mg/dL   GFR calc non Af Amer >60 >60 mL/min   GFR calc Af Amer >60 >60  mL/min   Anion gap 13 5 - 15    Comment: Performed at [facility], [address], [place], [place] [address]  Lipase, blood  Status: None   Collection Time: 01/08/19 10:57 AM  Result Value Ref Range   Lipase 18 11 - 51 U/L    Comment: Performed at [facility], [place] [address] [place], [place] [address]  CBC with Diff     Status: Abnormal   Collection Time: 01/08/19 10:57 AM  Result Value Ref Range   WBC 13.0 (H) 4.0 - 10.5 K/uL   RBC 3.85 (L) 3.87 - 5.11 MIL/uL   Hemoglobin 11.1 (L) 12.0 - 15.0 g/dL   HCT 33.6 (L) 36.0 - 46.0 %   MCV 87.3 80.0 - 100.0 fL   MCH 28.8 26.0 - 34.0 pg   MCHC 33.0 30.0 - 36.0 g/dL   RDW 12.7 11.5 - 15.5 %   Platelets 235 150 - 400 K/uL   nRBC 0.0 0.0 - 0.2 %   Neutrophils Relative % 74 %   Neutro Abs 9.7 (H) 1.7 - 7.7 K/uL   Lymphocytes Relative 21 %   Lymphs Abs 2.7 0.7 - 4.0 K/uL   Monocytes Relative 4 %   Monocytes Absolute 0.5 0.1 - 1.0 K/uL   Eosinophils Relative 1 %   Eosinophils Absolute 0.1 0.0 - 0.5 K/uL   Basophils Relative 0 %   Basophils Absolute 0.0 0.0 - 0.1 K/uL   Immature Granulocytes 0 %   Abs Immature Granulocytes 0.03 0.00 - 0.07 K/uL    Comment: Performed at [facility], [place] [address] [place], [place] [address]  I-Stat beta hCG blood, ED     Status: None   Collection Time: 01/08/19 11:18 AM  Result Value Ref Range   I-stat hCG, quantitative <5.0 <5 mIU/mL   Comment 3            Comment:   GEST. AGE      CONC.  (mIU/mL)   <=1 WEEK        5 - 50     2 WEEKS       50 - 500     3 WEEKS       100 - 10,000     4 WEEKS     1,000 - 30,000        FEMALE AND NON-PREGNANT FEMALE:     LESS THAN 5 mIU/mL   Urinalysis, Routine w reflex microscopic     Status: Abnormal   Collection Time: 01/08/19 12:46 PM  Result Value Ref Range   Color, Urine YELLOW YELLOW   APPearance CLEAR CLEAR   Specific Gravity, Urine 1.009 1.005 - 1.030   pH 6.0 5.0 - 8.0   Glucose, UA NEGATIVE NEGATIVE mg/dL   Hgb urine dipstick  NEGATIVE NEGATIVE   Bilirubin Urine NEGATIVE NEGATIVE   Ketones, ur 20 (A) NEGATIVE mg/dL   Protein, ur NEGATIVE NEGATIVE mg/dL   Nitrite NEGATIVE NEGATIVE   Leukocytes,Ua NEGATIVE NEGATIVE    Comment: Performed at [facility], [place] [address], [place], [place] [address]  Wet prep, genital     Status: Abnormal   Collection Time: 01/08/19  4:18 PM  Result Value Ref Range   Yeast Wet Prep HPF POC NONE SEEN NONE SEEN   Trich, Wet Prep NONE SEEN NONE SEEN   Clue Cells Wet Prep HPF POC NONE SEEN NONE SEEN   WBC, Wet Prep HPF POC MANY (A) NONE SEEN   Sperm PRESENT     Comment: Performed at [facility], [place] [address], [place], [place] [address]  Basic metabolic panel  Status: Abnormal   Collection Time: 01/08/19  5:25 PM  Result Value Ref Range   Sodium 140 135 - 145 mmol/L   Potassium 3.6 3.5 - 5.1 mmol/L   Chloride 107 98 - 111 mmol/L   CO2 23 22 - 32 mmol/L   Glucose, Bld 133 (H) 70 - 99 mg/dL   BUN 7 6 - 20 mg/dL   Creatinine, Ser 0.59 0.44 - 1.00 mg/dL   Calcium 9.2 8.9 - 10.3 mg/dL   GFR calc non Af Amer >60 >60 mL/min   GFR calc Af Amer >60 >60 mL/min   Anion gap 10 5 - 15    Comment: Performed at [place] [address], [place], [place] [address]  Basic metabolic panel     Status: Abnormal   Collection Time: 01/09/19  5:10 AM  Result Value Ref Range   Sodium 139 135 - 145 mmol/L   Potassium 3.7 3.5 - 5.1 mmol/L   Chloride 109 98 - 111 mmol/L   CO2 23 22 - 32 mmol/L   Glucose, Bld 89 70 - 99 mg/dL   BUN <5 (L) 6 - 20 mg/dL   Creatinine, Ser 0.45 0.44 - 1.00 mg/dL   Calcium 8.7 (L) 8.9 - 10.3 mg/dL   GFR calc non Af Amer >60 >60 mL/min   GFR calc Af Amer >60 >60 mL/min   Anion gap 7 5 - 15    Comment: Performed at [facility], [place] [address], [place], [place] [address]  CBC     Status: Abnormal   Collection Time: 01/09/19  5:10 AM  Result Value Ref Range   WBC 11.1 (H) 4.0 - 10.5 K/uL   RBC 3.63 (L) 3.87 - 5.11 MIL/uL   Hemoglobin  10.6 (L) 12.0 - 15.0 g/dL   HCT 31.0 (L) 36.0 - 46.0 %   MCV 85.4 80.0 - 100.0 fL   MCH 29.2 26.0 - 34.0 pg   MCHC 34.2 30.0 - 36.0 g/dL   RDW 12.8 11.5 - 15.5 %   Platelets 214 150 - 400 K/uL   nRBC 0.0 0.0 - 0.2 %    Comment: Performed at [facility], [place] [address] [place], [place] [address]    Ct Abdomen Pelvis W Contrast  Result Date: 01/08/2019 CLINICAL DATA:  Right flank pain EXAM: CT ABDOMEN AND PELVIS WITH CONTRAST TECHNIQUE: Multidetector CT imaging of the abdomen and pelvis was performed using the standard protocol following bolus administration of intravenous contrast. CONTRAST:  151mL OMNIPAQUE IOHEXOL 300 MG/ML  SOLN COMPARISON:  03/05/2015 FINDINGS: Lower chest: No acute abnormality. Hepatobiliary: Unremarkable Pancreas: Unremarkable Spleen: Unremarkable Adrenals/Urinary Tract: Moderate bilateral hydronephrosis and hydroureter. Unremarkable adrenal glands. Bladder is decompressed. Stomach/Bowel: Small and large bowel are decompressed. Stomach is decompressed. No obvious mass in the colon. Vascular/Lymphatic: No evidence of aortic aneurysm. Reproductive: There is an ill-defined mass emanating from the cervix and extending superiorly and posteriorly between the cervix and rectum. It is also inseparable from the serosa of the rectum. It measures up to 4.8 cm. The remainder of the uterus is within normal limits. IUD is in place. Other: No free fluid. Musculoskeletal: No vertebral compression deformity. IMPRESSION: Bilateral hydronephrosis and hydroureter are so seated with a pelvic mass is described above. The mass is interposed between the cervix and rectum and is inseparable from the 2 organs. Given the demographics, findings are most concerning for cervical carcinoma and bilateral ureteral obstruction. Electronically Signed   By: Marybelle Killings  M.D.   On: 01/08/2019 13:46  Compared with CT of pelvis 2007 at time of Urosepsis with septic shock, when no hydronephrosis or hydroureter  noted, though pt had percutaneous nephrostomy as part of care.   Assessment/Plan: 1 Exam consistent with significant endometriosis of [place] sac, advanced disease suspected by bilateral hydronephrosis and hydroureter. 2 Currently normal renal function present, encouraging.  Plan: I have contacted Dr Garwin Brothers who has reviewed the chart.     Current admission is for pain control, once that is adequately addressed, care can be addressed as outpatient. She asks that patient contact the office and schedule prompt early f/u.  Patient will likely need medical suppression of endometriosis with probable  LUPRON supressive therapy, and given severity of Disease, Dr Garwin Brothers states referral to [facility] for any planned surgical treatments likely.  Pt is at risk of recurrent ascending infection if she gets uti, urine culture pending.   Thank you for this interesting case and its dramatic presentation.    Jonnie Kind 01/09/2019 [phone] cell

## 2019-01-18 DIAGNOSIS — R102 Pelvic and perineal pain: Secondary | ICD-10-CM | POA: Diagnosis not present

## 2019-01-18 DIAGNOSIS — R1907 Generalized intra-abdominal and pelvic swelling, mass and lump: Secondary | ICD-10-CM | POA: Diagnosis not present

## 2019-02-17 DIAGNOSIS — N858 Other specified noninflammatory disorders of uterus: Secondary | ICD-10-CM | POA: Diagnosis not present

## 2019-02-17 DIAGNOSIS — R1900 Intra-abdominal and pelvic swelling, mass and lump, unspecified site: Secondary | ICD-10-CM | POA: Diagnosis not present

## 2019-02-17 DIAGNOSIS — N132 Hydronephrosis with renal and ureteral calculous obstruction: Secondary | ICD-10-CM | POA: Diagnosis not present

## 2019-02-22 DIAGNOSIS — N888 Other specified noninflammatory disorders of cervix uteri: Secondary | ICD-10-CM | POA: Diagnosis not present

## 2019-02-22 DIAGNOSIS — R1900 Intra-abdominal and pelvic swelling, mass and lump, unspecified site: Secondary | ICD-10-CM | POA: Diagnosis not present

## 2019-02-22 DIAGNOSIS — N1330 Unspecified hydronephrosis: Secondary | ICD-10-CM | POA: Diagnosis not present

## 2019-02-28 ENCOUNTER — Other Ambulatory Visit: Payer: Self-pay | Admitting: Obstetrics and Gynecology

## 2019-02-28 ENCOUNTER — Other Ambulatory Visit: Payer: Self-pay | Admitting: Sports Medicine

## 2019-02-28 ENCOUNTER — Other Ambulatory Visit: Payer: Self-pay | Admitting: Neurosurgery

## 2019-02-28 DIAGNOSIS — R1900 Intra-abdominal and pelvic swelling, mass and lump, unspecified site: Secondary | ICD-10-CM

## 2019-02-28 DIAGNOSIS — D497 Neoplasm of unspecified behavior of endocrine glands and other parts of nervous system: Secondary | ICD-10-CM

## 2019-03-06 ENCOUNTER — Ambulatory Visit
Admission: RE | Admit: 2019-03-06 | Discharge: 2019-03-06 | Disposition: A | Discharge status: Home / Self Care | Payer: BLUE CROSS/BLUE SHIELD | Source: Ambulatory Visit | Attending: Obstetrics and Gynecology | Admitting: Obstetrics and Gynecology

## 2019-03-06 ENCOUNTER — Other Ambulatory Visit: Payer: Self-pay

## 2019-03-06 DIAGNOSIS — D259 Leiomyoma of uterus, unspecified: Secondary | ICD-10-CM | POA: Diagnosis not present

## 2019-03-06 DIAGNOSIS — R1900 Intra-abdominal and pelvic swelling, mass and lump, unspecified site: Secondary | ICD-10-CM

## 2019-03-06 MED ORDER — GADOBENATE DIMEGLUMINE 529 MG/ML IV SOLN
10.0000 mL | Freq: Once | INTRAVENOUS | Status: AC | PRN
  Administered 2019-03-06: 10 mL via INTRAVENOUS

## 2019-03-07 ENCOUNTER — Other Ambulatory Visit: Payer: Self-pay | Admitting: Obstetrics and Gynecology

## 2019-03-07 DIAGNOSIS — R1900 Intra-abdominal and pelvic swelling, mass and lump, unspecified site: Secondary | ICD-10-CM

## 2019-03-08 DIAGNOSIS — R1900 Intra-abdominal and pelvic swelling, mass and lump, unspecified site: Secondary | ICD-10-CM | POA: Diagnosis not present

## 2019-03-16 ENCOUNTER — Other Ambulatory Visit: Payer: BLUE CROSS/BLUE SHIELD

## 2019-04-07 DIAGNOSIS — N809 Endometriosis, unspecified: Secondary | ICD-10-CM | POA: Diagnosis not present

## 2019-04-07 DIAGNOSIS — N1330 Unspecified hydronephrosis: Secondary | ICD-10-CM | POA: Diagnosis not present

## 2019-04-07 DIAGNOSIS — J45909 Unspecified asthma, uncomplicated: Secondary | ICD-10-CM | POA: Diagnosis not present

## 2019-04-07 DIAGNOSIS — Z86718 Personal history of other venous thrombosis and embolism: Secondary | ICD-10-CM | POA: Diagnosis not present

## 2019-04-07 DIAGNOSIS — R1900 Intra-abdominal and pelvic swelling, mass and lump, unspecified site: Secondary | ICD-10-CM | POA: Diagnosis not present

## 2019-04-07 DIAGNOSIS — Z880 Allergy status to penicillin: Secondary | ICD-10-CM | POA: Diagnosis not present

## 2019-04-07 DIAGNOSIS — Z1159 Encounter for screening for other viral diseases: Secondary | ICD-10-CM | POA: Diagnosis not present

## 2019-04-07 DIAGNOSIS — Z01818 Encounter for other preprocedural examination: Secondary | ICD-10-CM | POA: Diagnosis not present

## 2019-04-07 DIAGNOSIS — R971 Elevated cancer antigen 125 [CA 125]: Secondary | ICD-10-CM | POA: Diagnosis not present

## 2019-04-07 LAB — CBC AND DIFFERENTIAL
HCT: 39 (ref 36–46)
Hemoglobin: 12.8 (ref 12.0–16.0)
Platelets: 288 (ref 150–399)
WBC: 7.1

## 2019-04-07 LAB — BASIC METABOLIC PANEL
Creatinine: 0.4 — AB (ref <=1.1)
Glucose: 83
Potassium: 4 (ref 3.4–5.3)
Sodium: 143 (ref 137–147)

## 2019-04-07 LAB — NOVEL CORONAVIRUS, NAA: SARS-CoV-2, NAA: NEGATIVE

## 2019-04-10 DIAGNOSIS — N1330 Unspecified hydronephrosis: Secondary | ICD-10-CM | POA: Diagnosis not present

## 2019-04-10 DIAGNOSIS — R112 Nausea with vomiting, unspecified: Secondary | ICD-10-CM | POA: Diagnosis not present

## 2019-04-10 DIAGNOSIS — N808 Other endometriosis: Secondary | ICD-10-CM | POA: Diagnosis not present

## 2019-04-10 DIAGNOSIS — N803 Endometriosis of pelvic peritoneum: Secondary | ICD-10-CM | POA: Diagnosis not present

## 2019-04-10 DIAGNOSIS — N809 Endometriosis, unspecified: Secondary | ICD-10-CM | POA: Diagnosis not present

## 2019-04-10 DIAGNOSIS — N135 Crossing vessel and stricture of ureter without hydronephrosis: Secondary | ICD-10-CM | POA: Diagnosis not present

## 2019-04-10 DIAGNOSIS — Z86718 Personal history of other venous thrombosis and embolism: Secondary | ICD-10-CM | POA: Diagnosis not present

## 2019-04-10 DIAGNOSIS — J45909 Unspecified asthma, uncomplicated: Secondary | ICD-10-CM | POA: Diagnosis not present

## 2019-04-10 DIAGNOSIS — R140 Abdominal distension (gaseous): Secondary | ICD-10-CM | POA: Diagnosis not present

## 2019-04-10 DIAGNOSIS — N941 Unspecified dyspareunia: Secondary | ICD-10-CM | POA: Diagnosis not present

## 2019-04-10 DIAGNOSIS — D259 Leiomyoma of uterus, unspecified: Secondary | ICD-10-CM | POA: Diagnosis not present

## 2019-04-10 DIAGNOSIS — Z880 Allergy status to penicillin: Secondary | ICD-10-CM | POA: Diagnosis not present

## 2019-04-10 DIAGNOSIS — R1900 Intra-abdominal and pelvic swelling, mass and lump, unspecified site: Secondary | ICD-10-CM | POA: Diagnosis not present

## 2019-04-10 DIAGNOSIS — K567 Ileus, unspecified: Secondary | ICD-10-CM | POA: Diagnosis not present

## 2019-04-10 DIAGNOSIS — E861 Hypovolemia: Secondary | ICD-10-CM | POA: Diagnosis not present

## 2019-04-10 DIAGNOSIS — N800 Endometriosis of uterus: Secondary | ICD-10-CM | POA: Diagnosis not present

## 2019-04-10 DIAGNOSIS — N134 Hydroureter: Secondary | ICD-10-CM | POA: Diagnosis not present

## 2019-04-10 DIAGNOSIS — K560 Paralytic ileus: Secondary | ICD-10-CM | POA: Diagnosis not present

## 2019-04-10 DIAGNOSIS — N131 Hydronephrosis with ureteral stricture, not elsewhere classified: Secondary | ICD-10-CM | POA: Diagnosis not present

## 2019-04-10 DIAGNOSIS — K668 Other specified disorders of peritoneum: Secondary | ICD-10-CM | POA: Diagnosis not present

## 2019-04-28 HISTORY — PX: RADICAL HYSTERECTOMY: SHX2283

## 2019-05-03 ENCOUNTER — Encounter: Payer: Self-pay | Admitting: Family Medicine

## 2019-05-19 ENCOUNTER — Other Ambulatory Visit: Payer: Self-pay | Admitting: Obstetrics and Gynecology

## 2019-05-19 DIAGNOSIS — N134 Hydroureter: Secondary | ICD-10-CM

## 2019-05-22 ENCOUNTER — Ambulatory Visit
Admission: RE | Admit: 2019-05-22 | Discharge: 2019-05-22 | Disposition: A | Discharge status: Home / Self Care | Payer: BC Managed Care – PPO | Source: Ambulatory Visit | Attending: Obstetrics and Gynecology | Admitting: Obstetrics and Gynecology

## 2019-05-22 DIAGNOSIS — N1330 Unspecified hydronephrosis: Secondary | ICD-10-CM | POA: Diagnosis not present

## 2019-05-22 DIAGNOSIS — N134 Hydroureter: Secondary | ICD-10-CM

## 2019-05-31 ENCOUNTER — Other Ambulatory Visit: Payer: Self-pay | Admitting: Obstetrics and Gynecology

## 2019-06-14 DIAGNOSIS — N803 Endometriosis of pelvic peritoneum: Secondary | ICD-10-CM | POA: Diagnosis not present

## 2019-06-20 DIAGNOSIS — R293 Abnormal posture: Secondary | ICD-10-CM | POA: Diagnosis not present

## 2019-06-20 DIAGNOSIS — M9901 Segmental and somatic dysfunction of cervical region: Secondary | ICD-10-CM | POA: Diagnosis not present

## 2019-06-20 DIAGNOSIS — M9902 Segmental and somatic dysfunction of thoracic region: Secondary | ICD-10-CM | POA: Diagnosis not present

## 2019-06-20 DIAGNOSIS — N809 Endometriosis, unspecified: Secondary | ICD-10-CM | POA: Diagnosis not present

## 2019-06-22 DIAGNOSIS — M9902 Segmental and somatic dysfunction of thoracic region: Secondary | ICD-10-CM | POA: Diagnosis not present

## 2019-06-22 DIAGNOSIS — N809 Endometriosis, unspecified: Secondary | ICD-10-CM | POA: Diagnosis not present

## 2019-06-22 DIAGNOSIS — M9901 Segmental and somatic dysfunction of cervical region: Secondary | ICD-10-CM | POA: Diagnosis not present

## 2019-06-22 DIAGNOSIS — R293 Abnormal posture: Secondary | ICD-10-CM | POA: Diagnosis not present

## 2019-06-26 ENCOUNTER — Other Ambulatory Visit (HOSPITAL_COMMUNITY): Payer: Self-pay | Admitting: Urology

## 2019-06-26 DIAGNOSIS — R293 Abnormal posture: Secondary | ICD-10-CM | POA: Diagnosis not present

## 2019-06-26 DIAGNOSIS — N135 Crossing vessel and stricture of ureter without hydronephrosis: Secondary | ICD-10-CM | POA: Diagnosis not present

## 2019-06-26 DIAGNOSIS — N809 Endometriosis, unspecified: Secondary | ICD-10-CM | POA: Diagnosis not present

## 2019-06-26 DIAGNOSIS — M9901 Segmental and somatic dysfunction of cervical region: Secondary | ICD-10-CM | POA: Diagnosis not present

## 2019-06-26 DIAGNOSIS — M9902 Segmental and somatic dysfunction of thoracic region: Secondary | ICD-10-CM | POA: Diagnosis not present

## 2019-06-29 DIAGNOSIS — N809 Endometriosis, unspecified: Secondary | ICD-10-CM | POA: Diagnosis not present

## 2019-06-29 DIAGNOSIS — R293 Abnormal posture: Secondary | ICD-10-CM | POA: Diagnosis not present

## 2019-06-29 DIAGNOSIS — M9902 Segmental and somatic dysfunction of thoracic region: Secondary | ICD-10-CM | POA: Diagnosis not present

## 2019-06-29 DIAGNOSIS — M9901 Segmental and somatic dysfunction of cervical region: Secondary | ICD-10-CM | POA: Diagnosis not present

## 2019-07-03 DIAGNOSIS — M9901 Segmental and somatic dysfunction of cervical region: Secondary | ICD-10-CM | POA: Diagnosis not present

## 2019-07-03 DIAGNOSIS — R293 Abnormal posture: Secondary | ICD-10-CM | POA: Diagnosis not present

## 2019-07-03 DIAGNOSIS — N809 Endometriosis, unspecified: Secondary | ICD-10-CM | POA: Diagnosis not present

## 2019-07-03 DIAGNOSIS — M9902 Segmental and somatic dysfunction of thoracic region: Secondary | ICD-10-CM | POA: Diagnosis not present

## 2019-07-07 DIAGNOSIS — R293 Abnormal posture: Secondary | ICD-10-CM | POA: Diagnosis not present

## 2019-07-07 DIAGNOSIS — M9902 Segmental and somatic dysfunction of thoracic region: Secondary | ICD-10-CM | POA: Diagnosis not present

## 2019-07-07 DIAGNOSIS — M9901 Segmental and somatic dysfunction of cervical region: Secondary | ICD-10-CM | POA: Diagnosis not present

## 2019-07-07 DIAGNOSIS — N809 Endometriosis, unspecified: Secondary | ICD-10-CM | POA: Diagnosis not present

## 2019-07-11 ENCOUNTER — Other Ambulatory Visit: Payer: Self-pay

## 2019-07-11 ENCOUNTER — Ambulatory Visit (HOSPITAL_COMMUNITY)
Admission: RE | Admit: 2019-07-11 | Discharge: 2019-07-11 | Disposition: A | Discharge status: Home / Self Care | Payer: BC Managed Care – PPO | Source: Ambulatory Visit | Attending: Urology | Admitting: Urology

## 2019-07-11 DIAGNOSIS — N809 Endometriosis, unspecified: Secondary | ICD-10-CM | POA: Diagnosis not present

## 2019-07-11 DIAGNOSIS — N135 Crossing vessel and stricture of ureter without hydronephrosis: Secondary | ICD-10-CM | POA: Diagnosis not present

## 2019-07-11 DIAGNOSIS — M9902 Segmental and somatic dysfunction of thoracic region: Secondary | ICD-10-CM | POA: Diagnosis not present

## 2019-07-11 DIAGNOSIS — M9901 Segmental and somatic dysfunction of cervical region: Secondary | ICD-10-CM | POA: Diagnosis not present

## 2019-07-11 DIAGNOSIS — N1330 Unspecified hydronephrosis: Secondary | ICD-10-CM | POA: Diagnosis not present

## 2019-07-11 DIAGNOSIS — R293 Abnormal posture: Secondary | ICD-10-CM | POA: Diagnosis not present

## 2019-07-11 MED ORDER — TECHNETIUM TC 99M MERTIATIDE
4.8000 | Freq: Once | INTRAVENOUS | Status: AC
  Administered 2019-07-11: 10:00:00 4.8 via INTRAVENOUS

## 2019-07-11 MED ORDER — FUROSEMIDE 10 MG/ML IJ SOLN
27.0000 mg | Freq: Once | INTRAMUSCULAR | Status: AC
  Administered 2019-07-11: 10:00:00 27 mg via INTRAVENOUS

## 2019-07-11 MED ORDER — FUROSEMIDE 10 MG/ML IJ SOLN
INTRAMUSCULAR | Status: AC
  Administered 2019-07-11: 10:00:00 27 mg via INTRAVENOUS
  Filled 2019-07-11: qty 4

## 2019-07-14 DIAGNOSIS — M9901 Segmental and somatic dysfunction of cervical region: Secondary | ICD-10-CM | POA: Diagnosis not present

## 2019-07-14 DIAGNOSIS — R293 Abnormal posture: Secondary | ICD-10-CM | POA: Diagnosis not present

## 2019-07-14 DIAGNOSIS — N809 Endometriosis, unspecified: Secondary | ICD-10-CM | POA: Diagnosis not present

## 2019-07-14 DIAGNOSIS — M9902 Segmental and somatic dysfunction of thoracic region: Secondary | ICD-10-CM | POA: Diagnosis not present

## 2019-07-31 DIAGNOSIS — N131 Hydronephrosis with ureteral stricture, not elsewhere classified: Secondary | ICD-10-CM | POA: Diagnosis not present

## 2019-07-31 DIAGNOSIS — N135 Crossing vessel and stricture of ureter without hydronephrosis: Secondary | ICD-10-CM | POA: Diagnosis not present

## 2019-08-01 ENCOUNTER — Other Ambulatory Visit: Payer: Self-pay | Admitting: Urology

## 2019-08-03 DIAGNOSIS — Z03818 Encounter for observation for suspected exposure to other biological agents ruled out: Secondary | ICD-10-CM | POA: Diagnosis not present

## 2019-08-15 ENCOUNTER — Other Ambulatory Visit (HOSPITAL_COMMUNITY)
Admission: RE | Admit: 2019-08-15 | Discharge: 2019-08-15 | Disposition: A | Discharge status: Home / Self Care | Payer: BC Managed Care – PPO | Source: Ambulatory Visit | Attending: Urology | Admitting: Urology

## 2019-08-15 DIAGNOSIS — Z20828 Contact with and (suspected) exposure to other viral communicable diseases: Secondary | ICD-10-CM | POA: Insufficient documentation

## 2019-08-15 DIAGNOSIS — Z01812 Encounter for preprocedural laboratory examination: Secondary | ICD-10-CM | POA: Diagnosis not present

## 2019-08-16 LAB — NOVEL CORONAVIRUS, NAA (HOSP ORDER, SEND-OUT TO REF LAB; TAT 18-24 HRS): SARS-CoV-2, NAA: NOT DETECTED

## 2019-08-17 ENCOUNTER — Other Ambulatory Visit: Payer: Self-pay

## 2019-08-17 ENCOUNTER — Encounter (HOSPITAL_BASED_OUTPATIENT_CLINIC_OR_DEPARTMENT_OTHER): Payer: Self-pay | Admitting: *Deleted

## 2019-08-17 NOTE — Progress Notes (Signed)
Spoke w/ via phone for pre-op interview---[name] needs dos----       none       Lopez results------ COVID test ------08-15-2019 Arrive at -------815 am 08-18-2019 NPO after ------mdinight Medications to take morning of surgery -----none Diabetic medication -----n/a Patient Special Instructions ----- Pre-Op special Istructions ----- Patient verbalized understanding of instructions that were given at this phone interview. Patient denies shortness of breath, chest pain, fever, cough a this phone interview.

## 2019-08-18 ENCOUNTER — Encounter (HOSPITAL_BASED_OUTPATIENT_CLINIC_OR_DEPARTMENT_OTHER): Payer: Self-pay | Admitting: *Deleted

## 2019-08-18 ENCOUNTER — Other Ambulatory Visit: Payer: Self-pay

## 2019-08-18 ENCOUNTER — Ambulatory Visit (HOSPITAL_BASED_OUTPATIENT_CLINIC_OR_DEPARTMENT_OTHER)
Admission: RE | Admit: 2019-08-18 | Discharge: 2019-08-18 | Disposition: A | Discharge status: Home / Self Care | Payer: BC Managed Care – PPO | Attending: Urology | Admitting: Urology

## 2019-08-18 ENCOUNTER — Ambulatory Visit (HOSPITAL_BASED_OUTPATIENT_CLINIC_OR_DEPARTMENT_OTHER): Payer: BC Managed Care – PPO | Admitting: Anesthesiology

## 2019-08-18 ENCOUNTER — Encounter (HOSPITAL_BASED_OUTPATIENT_CLINIC_OR_DEPARTMENT_OTHER)
Admission: RE | Disposition: A | Discharge status: Home / Self Care | Payer: Self-pay | Source: Home / Self Care | Attending: Urology

## 2019-08-18 DIAGNOSIS — Z880 Allergy status to penicillin: Secondary | ICD-10-CM | POA: Insufficient documentation

## 2019-08-18 DIAGNOSIS — Z8249 Family history of ischemic heart disease and other diseases of the circulatory system: Secondary | ICD-10-CM | POA: Diagnosis not present

## 2019-08-18 DIAGNOSIS — I82409 Acute embolism and thrombosis of unspecified deep veins of unspecified lower extremity: Secondary | ICD-10-CM | POA: Diagnosis not present

## 2019-08-18 DIAGNOSIS — Z79899 Other long term (current) drug therapy: Secondary | ICD-10-CM | POA: Diagnosis not present

## 2019-08-18 DIAGNOSIS — N131 Hydronephrosis with ureteral stricture, not elsewhere classified: Secondary | ICD-10-CM | POA: Insufficient documentation

## 2019-08-18 DIAGNOSIS — J45909 Unspecified asthma, uncomplicated: Secondary | ICD-10-CM | POA: Diagnosis not present

## 2019-08-18 HISTORY — PX: BALLOON DILATION: SHX5330

## 2019-08-18 HISTORY — PX: CYSTOSCOPY WITH RETROGRADE PYELOGRAM, URETEROSCOPY AND STENT PLACEMENT: SHX5789

## 2019-08-18 HISTORY — DX: Personal history of urinary calculi: Z87.442

## 2019-08-18 SURGERY — CYSTOURETEROSCOPY, WITH RETROGRADE PYELOGRAM AND STENT INSERTION
Anesthesia: General | Site: Ureter | Laterality: Left

## 2019-08-18 MED ORDER — LIDOCAINE HCL URETHRAL/MUCOSAL 2 % EX GEL
CUTANEOUS | Status: DC | PRN
  Administered 2019-08-18: 1

## 2019-08-18 MED ORDER — LIDOCAINE 2% (20 MG/ML) 5 ML SYRINGE
INTRAMUSCULAR | Status: AC
  Filled 2019-08-18: qty 5

## 2019-08-18 MED ORDER — ONDANSETRON HCL 4 MG/2ML IJ SOLN
INTRAMUSCULAR | Status: DC | PRN
  Administered 2019-08-18: 4 mg via INTRAVENOUS

## 2019-08-18 MED ORDER — SODIUM CHLORIDE 0.9 % IR SOLN
Status: DC | PRN
  Administered 2019-08-18: 3000 mL via INTRAVESICAL

## 2019-08-18 MED ORDER — LACTATED RINGERS IV SOLN
INTRAVENOUS | Status: DC
  Administered 2019-08-18 (×2): via INTRAVENOUS
  Filled 2019-08-18: qty 1000

## 2019-08-18 MED ORDER — PROPOFOL 10 MG/ML IV BOLUS
INTRAVENOUS | Status: AC
  Filled 2019-08-18: qty 20

## 2019-08-18 MED ORDER — DEXAMETHASONE SODIUM PHOSPHATE 10 MG/ML IJ SOLN
INTRAMUSCULAR | Status: DC | PRN
  Administered 2019-08-18: 10 mg via INTRAVENOUS

## 2019-08-18 MED ORDER — LIDOCAINE 2% (20 MG/ML) 5 ML SYRINGE
INTRAMUSCULAR | Status: DC | PRN
  Administered 2019-08-18: 60 mg via INTRAVENOUS

## 2019-08-18 MED ORDER — PROPOFOL 10 MG/ML IV BOLUS
INTRAVENOUS | Status: DC | PRN
  Administered 2019-08-18: 100 mg via INTRAVENOUS
  Administered 2019-08-18: 50 mg via INTRAVENOUS
  Administered 2019-08-18: 150 mg via INTRAVENOUS

## 2019-08-18 MED ORDER — FENTANYL CITRATE (PF) 100 MCG/2ML IJ SOLN
25.0000 ug | INTRAMUSCULAR | Status: DC | PRN
  Filled 2019-08-18: qty 1

## 2019-08-18 MED ORDER — IOHEXOL 300 MG/ML  SOLN
INTRAMUSCULAR | Status: DC | PRN
  Administered 2019-08-18: 11:00:00 35 mL via URETHRAL

## 2019-08-18 MED ORDER — CIPROFLOXACIN IN D5W 400 MG/200ML IV SOLN
INTRAVENOUS | Status: AC
  Filled 2019-08-18: qty 200

## 2019-08-18 MED ORDER — ACETAMINOPHEN 10 MG/ML IV SOLN
1000.0000 mg | Freq: Once | INTRAVENOUS | Status: DC | PRN
  Filled 2019-08-18: qty 100

## 2019-08-18 MED ORDER — CIPROFLOXACIN IN D5W 400 MG/200ML IV SOLN
400.0000 mg | INTRAVENOUS | Status: AC
  Administered 2019-08-18: 400 mg via INTRAVENOUS
  Filled 2019-08-18: qty 200

## 2019-08-18 MED ORDER — DEXAMETHASONE SODIUM PHOSPHATE 10 MG/ML IJ SOLN
INTRAMUSCULAR | Status: AC
  Filled 2019-08-18: qty 1

## 2019-08-18 MED ORDER — FENTANYL CITRATE (PF) 100 MCG/2ML IJ SOLN
INTRAMUSCULAR | Status: AC
  Filled 2019-08-18: qty 2

## 2019-08-18 MED ORDER — OXYCODONE HCL 5 MG/5ML PO SOLN
5.0000 mg | Freq: Once | ORAL | Status: AC | PRN
  Filled 2019-08-18: qty 5

## 2019-08-18 MED ORDER — FENTANYL CITRATE (PF) 100 MCG/2ML IJ SOLN
INTRAMUSCULAR | Status: DC | PRN
  Administered 2019-08-18: 50 ug via INTRAVENOUS

## 2019-08-18 MED ORDER — PROMETHAZINE HCL 25 MG/ML IJ SOLN
6.2500 mg | INTRAMUSCULAR | Status: DC | PRN
  Filled 2019-08-18: qty 1

## 2019-08-18 MED ORDER — MIDAZOLAM HCL 2 MG/2ML IJ SOLN
INTRAMUSCULAR | Status: DC | PRN
  Administered 2019-08-18 (×2): 1 mg via INTRAVENOUS

## 2019-08-18 MED ORDER — ONDANSETRON HCL 4 MG/2ML IJ SOLN
INTRAMUSCULAR | Status: AC
  Filled 2019-08-18: qty 2

## 2019-08-18 MED ORDER — MIDAZOLAM HCL 2 MG/2ML IJ SOLN
INTRAMUSCULAR | Status: AC
  Filled 2019-08-18: qty 2

## 2019-08-18 MED ORDER — OXYCODONE HCL 5 MG PO TABS
5.0000 mg | ORAL_TABLET | Freq: Once | ORAL | Status: AC | PRN
  Administered 2019-08-18: 5 mg via ORAL
  Filled 2019-08-18: qty 1

## 2019-08-18 MED ORDER — OXYCODONE HCL 5 MG PO TABS
ORAL_TABLET | ORAL | Status: AC
  Filled 2019-08-18: qty 1

## 2019-08-18 SURGICAL SUPPLY — 23 items
BAG DRAIN URO-CYSTO SKYTR STRL (DRAIN) ×4 IMPLANT
BAG DRN UROCATH (DRAIN) ×2
BASKET LASER NITINOL 1.9FR (BASKET) IMPLANT
BSKT STON RTRVL 120 1.9FR (BASKET)
CATH URET 5FR 28IN OPEN ENDED (CATHETERS) ×4 IMPLANT
CATH URET DUAL LUMEN 6-10FR 50 (CATHETERS) IMPLANT
CLOTH BEACON ORANGE TIMEOUT ST (SAFETY) ×4 IMPLANT
EXTRACTOR STONE 1.7FRX115CM (UROLOGICAL SUPPLIES) IMPLANT
FIBER LASER TRAC TIP (UROLOGICAL SUPPLIES) IMPLANT
GLOVE BIO SURGEON STRL SZ7.5 (GLOVE) ×4 IMPLANT
GOWN STRL REUS W/TWL XL LVL3 (GOWN DISPOSABLE) ×4 IMPLANT
GUIDEWIRE STR DUAL SENSOR (WIRE) ×4 IMPLANT
IV NS IRRIG 3000ML ARTHROMATIC (IV SOLUTION) ×6 IMPLANT
KIT BALLN UROMAX 15FX4 (MISCELLANEOUS) IMPLANT
KIT BALLN UROMAX 26 75X4 (MISCELLANEOUS) ×2
KIT TURNOVER CYSTO (KITS) ×4 IMPLANT
MANIFOLD NEPTUNE II (INSTRUMENTS) ×2 IMPLANT
NS IRRIG 500ML POUR BTL (IV SOLUTION) ×4 IMPLANT
PACK CYSTO (CUSTOM PROCEDURE TRAY) ×4 IMPLANT
STENT URET 6FRX24 CONTOUR (STENTS) ×2 IMPLANT
TUBE CONNECTING 12'X1/4 (SUCTIONS) ×1
TUBE CONNECTING 12X1/4 (SUCTIONS) ×1 IMPLANT
TUBING UROLOGY SET (TUBING) ×4 IMPLANT

## 2019-08-18 NOTE — Anesthesia Postprocedure Evaluation (Signed)
Anesthesia Post Note  Patient: [name]  Procedure(s) Performed: CYSTOSCOPY WITH BILATERAL  RETROGRADE PYELOGRAM, LEFT DIAGNOSTICURETEROSCOPY AND LEFT  STENT PLACEMENT (Bilateral Pelvis) LEFT URETERAL  DILATION (Left Ureter)     Patient location during evaluation: PACU Anesthesia Type: General Level of consciousness: awake and alert Pain management: pain level controlled Vital Signs Assessment: post-procedure vital signs reviewed and stable Respiratory status: spontaneous breathing, nonlabored ventilation, respiratory function stable and patient connected to nasal cannula oxygen Cardiovascular status: blood pressure returned to baseline and stable Postop Assessment: no apparent nausea or vomiting Anesthetic complications: no    Last Vitals:  Vitals:   08/18/19 1118 08/18/19 1130  BP: 102/76 101/77  Pulse: (!) 108 100  Resp: 12 14  Temp: (!) 36.4 C   SpO2: 100% 100%    Last Pain:  Vitals:   08/18/19 1118  TempSrc:   PainSc: 0-No pain                 Mayla Biddy S

## 2019-08-18 NOTE — Discharge Instructions (Signed)
DISCHARGE INSTRUCTIONS FOR URETERAL STENT   MEDICATIONS:  1.  Resume all your other meds from home - except do not take any extra narcotic pain meds that you may have at home.  2. Pyridium is to help with the burning/stinging when you urinate. 3. Tramadol is for moderate/severe pain, otherwise taking upto 1000 mg every 6 hours of plainTylenol will help treat your pain.   ACTIVITY:  1. No strenuous activity x 1week  2. No driving while on narcotic pain medications  3. Drink plenty of water  4. Continue to walk at home - you can still get blood clots when you are at home, so keep active, but don't over do it.  5. May return to work/school tomorrow or when you feel ready   BATHING:  1. You can shower and we recommend daily showers    SIGNS/SYMPTOMS TO CALL:  Please call us if you have a fever greater than 101.5, uncontrolled nausea/vomiting, uncontrolled pain, dizziness, unable to urinate, bloody urine, chest pain, shortness of breath, leg swelling, leg pain, redness around wound, drainage from wound, or any other concerns or questions.   You can reach [place] at [phone].   FOLLOW-UP:  1.  You have an appointment in 2 weeks for an ultrasound of your kidneys and stent removal.  CYSTOSCOPY HOME CARE INSTRUCTIONS  Activity: Rest for the remainder of the day.  Do not drive or operate equipment today.  You may resume normal activities in one to two days as instructed by your physician.   Meals: Drink plenty of liquids and eat light foods such as gelatin or soup this evening.  You may return to a normal meal plan tomorrow.  Return to Work: You may return to work in one to two days or as instructed by your physician.  Special Instructions / Symptoms: Call your physician if any of these symptoms occur:   -persistent or heavy bleeding  -bleeding which continues after first few urination  -large blood clots that are difficult to pass  -urine stream diminishes or stops  completely  -fever equal to or higher than 101 degrees Farenheit.  -cloudy urine with a strong, foul odor  -severe pain  Females should always wipe from front to back after elimination.  You may feel some burning pain when you urinate.  This should disappear with time.  Applying moist heat to the lower abdomen or a hot tub bath may help relieve the pain. \  Follow-Up / Date of Return Visit to Your Physician:  As instructed Call for an appointment to arrange follow-up.  Patient Signature:  ________________________________________________________  Nurse's Signature:  ________________________________________________________  Post Anesthesia Home Care Instructions  Activity: Get plenty of rest for the remainder of the day. A responsible individual must stay with you for 24 hours following the procedure.  For the next 24 hours, DO NOT: -Drive a car -Paediatric nurse -Drink alcoholic beverages -Take any medication unless instructed by your physician -Make any legal decisions or sign important papers.  Meals: Start with liquid foods such as gelatin or soup. Progress to regular foods as tolerated. Avoid greasy, spicy, heavy foods. If nausea and/or vomiting occur, drink only clear liquids until the nausea and/or vomiting subsides. Call your physician if vomiting continues.  Special Instructions/Symptoms: Your throat may feel dry or sore from the anesthesia or the breathing tube placed in your throat during surgery. If this causes discomfort, gargle with warm salt water. The discomfort should disappear within 24 hours.  If you had  a scopolamine patch placed behind your ear for the management of post- operative nausea and/or vomiting:  1. The medication in the patch is effective for 72 hours, after which it should be removed.  Wrap patch in a tissue and discard in the trash. Wash hands thoroughly with soap and water. 2. You may remove the patch earlier than 72 hours if you experience  unpleasant side effects which may include dry mouth, dizziness or visual disturbances. 3. Avoid touching the patch. Wash your hands with soap and water after contact with the patch.

## 2019-08-18 NOTE — Interval H&P Note (Signed)
History and Physical Interval Note:  08/18/2019 10:17 AM  [name]  has presented today for surgery, with the diagnosis of LEFT URETERAL STRICTURE.  The various methods of treatment have been discussed with the patient and family. After consideration of risks, benefits and other options for treatment, the patient has consented to  Procedure(s): CYSTOSCOPY WITH BILATERAL  RETROGRADE PYELOGRAM, LEFT DIAGNOSTICURETEROSCOPY AND LEFT  STENT PLACEMENT (Bilateral) LEFT URETERAL  DILATION (Left) as a surgical intervention.  The patient's history has been reviewed, patient examined, no change in status, stable for surgery.  I have reviewed the patient's chart and labs.  Questions were answered to the patient's satisfaction.     Ardis Hughs

## 2019-08-18 NOTE — Transfer of Care (Signed)
Immediate Anesthesia Transfer of Care Note  Patient: [name]  Procedure(s) Performed: CYSTOSCOPY WITH BILATERAL  RETROGRADE PYELOGRAM, LEFT DIAGNOSTICURETEROSCOPY AND LEFT  STENT PLACEMENT (Bilateral Pelvis) LEFT URETERAL  DILATION (Left Ureter)  Patient Location: PACU  Anesthesia Type:General  Level of Consciousness: awake, alert , oriented and patient cooperative  Airway & Oxygen Therapy: Patient Spontanous Breathing and Patient connected to nasal cannula oxygen  Post-op Assessment: Report given to RN and Post -op Vital signs reviewed and stable  Post vital signs: Reviewed and stable  Last Vitals:  Vitals Value Taken Time  BP 102/76 08/18/19 1119  Temp 36.4 C 08/18/19 1118  Pulse 109 08/18/19 1122  Resp 10 08/18/19 1122  SpO2 100 % 08/18/19 1122  Vitals shown include unvalidated device data.  Last Pain:  Vitals:   08/18/19 1118  TempSrc:   PainSc: 0-No pain      Patients Stated Pain Goal: 4 ([id number])  Complications: No apparent anesthesia complications

## 2019-08-18 NOTE — Op Note (Signed)
Preoperative diagnosis:  1. Left-sided ureteral obstruction 2. Left hydronephrosis  Postoperative diagnosis:  1. Same  Procedure: 1. Cystoscopy, bilateral retrograde pyelogram with interpretation 2. Bilateral ureteroscopy 3. Left ureteral balloon dilation and stent placement  Surgeon: Ardis Hughs, MD  Anesthesia: General  Complications: None  Intraoperative findings:  #1: The patient's bladder was normal-appearing.  Her right ureteral orifice was slightly medial compared to the expected orthotopic location.  There were no bladder mucosal abnormalities. #2: The right retrograde pyelogram was performed with a 5 [place] open-ended ureteral catheter.  This demonstrated distal ureteral medial deviation and very narrow distal/UVJ of the ureter.  There was mild hydronephrosis, sharp calyces and no filling defects. #3: The patient's left retrograde pyelogram was performed with a 5 [place] open-ended catheter and demonstrated a normal UVJ and very distal ureter.  However, there was a narrowing several centimeters proximal to the UVJ, and notably clips in that area from her prior surgery.  There was severe hydronephrosis above this with a tortuous ureter and a very dilated left collecting system. #4: Right distal ureteroscopy demonstrated a normal-appearing ureteral mucosa with no significant abnormalities, was easy to navigate without any dilation. #5: The left distal ureteroscopy demonstrated a narrowing in the area as seen on the retrograde, but this was soft and very easily navigated beyond with the semirigid ureteroscope.  There were no other significant abnormalities noted within the ureter. #6: I used a 15 French x10 cm ureteral balloon dilator to dilate the area in the distal ureter.  This was dilated successfully, but a repeat retrograde demonstrated persistent stricture with only mild improvement. #7: A 24 cm x 6 French double-J ureteral stent was easily passed beyond the stricture and  successfully placed in the patient's left renal pelvis.  EBL: Minimal  Specimens: None  Indication: [name] is a 33 y.o. patient with history of a large uterine fibroid and severe endometriosis.  This was creating bilateral hydronephrosis.  She underwent a hysterectomy earlier this summer.  Due to the severity of the disease they were unable to completely resect all of the endometriosis.  The postop setting she had resolution of her right hydronephrosis but persistent left-sided hydroureteronephrosis.  After reviewing the management options for treatment, he elected to proceed with the above surgical procedure(s). We have discussed the potential benefits and risks of the procedure, side effects of the proposed treatment, the likelihood of the patient achieving the goals of the procedure, and any potential problems that might occur during the procedure or recuperation. Informed consent has been obtained.  Description of procedure:  The patient was taken to the operating room and general anesthesia was induced.  The patient was placed in the dorsal lithotomy position, prepped and draped in the usual sterile fashion, and preoperative antibiotics were administered. A preoperative time-out was performed.   21 French 30 degrees cystoscope was gently passed to the patient's urethra into the bladder under visual guidance.  Cystoscopy was performed with the above findings.  A 5 French open-ended ureteral catheter was then used to perform a right retrograde pyelogram with the above findings.  I then cannulated the patient's left ureteral orifice and performed a retrograde pyelogram as described above.  I then advanced a sensor wire through the open-ended catheter and up into the left renal pelvis removing the catheter over the wire.  I then drained the bladder and removed the cystoscope.  I used the semirigid ureteroscope to cannulate the patient's left ureteral orifice and was able to easily  navigate up  into the left proximal ureter with the above findings.  I also at this point performed right ureteroscopy up into the renal brim with the above findings.  I then removed the ureteroscope and advanced a 15 French x10 cm balloon dilator over the wire and across the strictured segment of the left distal ureter.  I inflated the balloon and held it for 90 seconds and then took the balloon down.  I then repasses a 5 [place] open-ended ureteral catheter to perform a post dilation retrograde pyelogram.  This demonstrated no significant improvement in this area.  I then opted to dilate this area a second time in a similar fashion.  Again the post retrograde pyelogram demonstrated mild improvement.  At this time I opted to pass a 6 [place] x24 double-J ureteral stent over the wire and into the left renal pelvis.  There was some difficulty getting the wire all the way up into the pelvis given the tortuosity of the ureter, but I was able to straighten it prior to placing the stent.  The stent was noted to be well positioned in the midportion of the collecting system on fluoroscopy.  Once the stent was well within the renal pelvis I advanced it to the bladder neck and remove the wire noting a nice curl in the bladder as well.  The bladder was subsequently emptied.  Lidocaine jelly was placed in the patient's urethra and she was subsequently extubated return the PACU in good condition.  Ardis Hughs, M.D.

## 2019-08-18 NOTE — Anesthesia Preprocedure Evaluation (Signed)
Anesthesia Evaluation  Patient identified by MRN, date of birth, ID band Patient awake    Reviewed: Allergy & Precautions, NPO status , Patient's Chart, lab work & pertinent test results  Airway Mallampati: II  TM Distance: >3 FB Neck ROM: Full    Dental no notable dental hx.    Pulmonary neg pulmonary ROS,    Pulmonary exam normal breath sounds clear to auscultation       Cardiovascular negative cardio ROS Normal cardiovascular exam Rhythm:Regular Rate:Normal     Neuro/Psych negative neurological ROS  negative psych ROS   GI/Hepatic negative GI ROS, Neg liver ROS,   Endo/Other  negative endocrine ROS  Renal/GU negative Renal ROS  negative genitourinary   Musculoskeletal negative musculoskeletal ROS (+)   Abdominal   Peds negative pediatric ROS (+)  Hematology negative hematology ROS (+)   Anesthesia Other Findings   Reproductive/Obstetrics negative OB ROS                             Anesthesia Physical Anesthesia Plan  ASA: II  Anesthesia Plan: General   Post-op Pain Management:    Induction: Intravenous  PONV Risk Score and Plan: 3 and Ondansetron, Dexamethasone, Midazolam and Treatment may vary due to age or medical condition  Airway Management Planned: LMA  Additional Equipment:   Intra-op Plan:   Post-operative Plan: Extubation in OR  Informed Consent: I have reviewed the patients History and Physical, chart, labs and discussed the procedure including the risks, benefits and alternatives for the proposed anesthesia with the patient or authorized representative who has indicated his/her understanding and acceptance.     Dental advisory given  Plan Discussed with: CRNA and Surgeon  Anesthesia Plan Comments:         Anesthesia Quick Evaluation

## 2019-08-18 NOTE — Anesthesia Procedure Notes (Signed)
Procedure Name: LMA Insertion Date/Time: 08/18/2019 10:29 AM Performed by: Mechele Claude, CRNA Pre-anesthesia Checklist: Patient identified, Emergency Drugs available, Suction available and Patient being monitored Patient Re-evaluated:Patient Re-evaluated prior to induction Oxygen Delivery Method: Circle system utilized Preoxygenation: Pre-oxygenation with 100% oxygen Induction Type: IV induction Ventilation: Mask ventilation without difficulty LMA: LMA inserted LMA Size: 4.0 Number of attempts: 1 Airway Equipment and Method: Bite block Placement Confirmation: positive ETCO2 Tube secured with: Tape Dental Injury: Teeth and Oropharynx as per pre-operative assessment

## 2019-08-18 NOTE — H&P (Signed)
hydronephrosis.  HPI: [name] is a 33 year-old female established patient who is here for hydronephrosis.  The problem is on the left side. She is not having pain. She is not currently having flank pain, back pain, groin pain, nausea, vomiting, fever or chills.   The patient has not had recent labs. They had some abnormal labs, including not.   She has had previous abdominal surgery. She has had 1 surgery. She has not had a stent placed in her kidney. She has not received radiation therapy. The patient does not have a history of recurrent UTIs.   The patient underwent pelvic mass resection and hysterectomy in July 2020 for what turned out to be severe endometriosis. The ovaries were here it to her bowel and were not resected. At that time she was noted to have bilateral hydronephrosis. Subsequent follow-up ultrasound demonstrated near resolution of her right hydronephrosis but ongoing moderate left-sided hydronephrosis. Since the surgery the patient has not had any ongoing left-sided flank pain. She has had episodes of pain that coincided with her menstrual cycle. However this has been absent since her surgery. She denies any hematuria. She denies any history of recurrent infections.   Intv: The patient underwent a Lasix renogram which demonstrated a partially obstructed left collecting system with left-sided hydro consistent with a ureteral obstruction. The left kidney and 54% differential function. The T half clearance was 28 minutes. The patient states that she has intermittent left-sided flank pain, which seems to be most associated with her menstrual cycle although she does not men straight because of her previous hysterectomy. Her back pain is described as mild. She denies any hematuria. She denies any dysuria.     ALLERGIES: Amoxicillin - Hives    MEDICATIONS: Singulair  Xyzal     GU PSH: Hysterectomy, Partial       PSH Notes: Wisdom teeth in 2002   NON-GU PSH: None   GU PMH:  Ureteral stricture - 06/26/2019    NON-GU PMH: Asthma DVT, History    FAMILY HISTORY: 1 Daughter - Daughter 1 son - Son Hypercholesterolemia - Father, Mother Hypertension - Mother, Father Kidney Failure - Grandfather, Grandmother kidney stone - Father   SOCIAL HISTORY: Marital Status: Married Preferred Language: English; Race: Black or African American Current Smoking Status: Patient has never smoked.   Tobacco Use Assessment Completed: Used Tobacco in last 30 days? Drinks 1 drink per week.  Drinks 1 caffeinated drink per day. 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    REVIEW OF SYSTEMS:    GU Review Female:   Patient denies frequent urination, hard to postpone urination, burning /pain with urination, get up at night to urinate, leakage of urine, stream starts and stops, trouble starting your stream, have to strain to urinate, and being pregnant.  Gastrointestinal (Upper):   Patient denies nausea, vomiting, and indigestion/ heartburn.  Gastrointestinal (Lower):   Patient denies diarrhea and constipation.  Constitutional:   Patient denies fever, night sweats, weight loss, and fatigue.  Skin:   Patient denies skin rash/ lesion and itching.  Eyes:   Patient denies blurred vision and double vision.  Ears/ Nose/ Throat:   Patient denies sore throat and sinus problems.  Hematologic/Lymphatic:   Patient denies swollen glands and easy bruising.  Cardiovascular:   Patient denies leg swelling and chest pains.  Respiratory:   Patient denies cough and shortness of breath.  Endocrine:   Patient denies excessive thirst.  Musculoskeletal:   Patient denies back pain and joint  pain.  Neurological:   Patient denies headaches and dizziness.  Psychologic:   Patient denies depression and anxiety.   VITAL SIGNS:      07/31/2019 09:59 AM  Weight 110 lb / 49.9 kg  Height 65 in / 165.1 cm  BP 105/70 mmHg  Heart Rate 79 /min  Temperature 98.6 F / 37 C  BMI 18.3 kg/m    MULTI-SYSTEM PHYSICAL EXAMINATION:    Constitutional: Well-nourished. No physical deformities. Normally developed. Good grooming.  Respiratory: Normal breath sounds. No labored breathing, no use of accessory muscles.   Cardiovascular: Regular rate and rhythm. No murmur, no gallop. Normal temperature, normal extremity pulses, no swelling, no varicosities.      PAST DATA REVIEWED:  Source Of History:  Patient  Records Review:   Previous Doctor Records, Previous Patient Records, POC Tool  X-Ray Review: Renogram: Reviewed Films. Discussed With Patient.     PROCEDURES:          Urinalysis w/Scope Dipstick Dipstick Cont'd Micro  Color: Yellow Bilirubin: Neg mg/dL WBC/hpf: 0 - 5/hpf  Appearance: Slightly Cloudy Ketones: Neg mg/dL RBC/hpf: 3 - 10/hpf  Specific Gravity: 1.025 Blood: Trace ery/uL Bacteria: Mod (26-50/hpf)  pH: 6.0 Protein: 1+ mg/dL Cystals: Ca Oxalate  Glucose: Neg mg/dL Urobilinogen: 0.2 mg/dL Casts: NS (Not Seen)    Nitrites: Neg Trichomonas: Not Present    Leukocyte Esterase: 1+ leu/uL Mucous: Present      Epithelial Cells: 6 - 10/hpf      Yeast: NS (Not Seen)      Sperm: Not Present    ASSESSMENT:      ICD-10 Details  1 GU:   Ureteral obstruction - N13.1   2   Ureteral stricture - N13.5    PLAN:           Orders Labs Urine Culture          Schedule Return Visit/Planned Activity: ASAP - Schedule Surgery          Document Letter(s):  Created for Patient: Clinical Summary         Notes:   The patient has persistent left hydronephrosis with mild to moderate obstruction of the left kidney. Fortunately her left kidney still has significant function.   I spoke to the patient about her circumstance and recommended that we proceed to the operating room for cystoscopy, bilateral retrograde pyelograms, left diagnostic ureteroscopy and left ureteral balloon dilation/stent placement. If we can dilate this area of obstruction she may not need any additional  treatment. Will get this scheduled her soon. Following stent removal after 2 weeks we would then repeat her ultrasound in 6 weeks to see how for hydronephrosis improves. Ultimately, if things do not improve with ureteral dilation she may need a reimplant at some point.

## 2019-08-21 ENCOUNTER — Encounter (HOSPITAL_BASED_OUTPATIENT_CLINIC_OR_DEPARTMENT_OTHER): Payer: Self-pay | Admitting: Urology

## 2019-08-21 DIAGNOSIS — N803 Endometriosis of pelvic peritoneum: Secondary | ICD-10-CM | POA: Diagnosis not present

## 2019-09-01 DIAGNOSIS — N131 Hydronephrosis with ureteral stricture, not elsewhere classified: Secondary | ICD-10-CM | POA: Diagnosis not present

## 2019-09-19 DIAGNOSIS — N809 Endometriosis, unspecified: Secondary | ICD-10-CM | POA: Diagnosis not present

## 2019-09-24 ENCOUNTER — Other Ambulatory Visit: Payer: Self-pay

## 2019-10-13 ENCOUNTER — Other Ambulatory Visit (HOSPITAL_COMMUNITY): Payer: Self-pay | Admitting: Urology

## 2019-10-13 DIAGNOSIS — N135 Crossing vessel and stricture of ureter without hydronephrosis: Secondary | ICD-10-CM

## 2019-10-18 DIAGNOSIS — N809 Endometriosis, unspecified: Secondary | ICD-10-CM | POA: Diagnosis not present

## 2019-10-25 ENCOUNTER — Other Ambulatory Visit: Payer: Self-pay

## 2019-10-25 ENCOUNTER — Encounter (HOSPITAL_COMMUNITY)
Admission: RE | Admit: 2019-10-25 | Discharge: 2019-10-25 | Disposition: A | Discharge status: Home / Self Care | Payer: BC Managed Care – PPO | Source: Ambulatory Visit | Attending: Urology | Admitting: Urology

## 2019-10-25 DIAGNOSIS — N135 Crossing vessel and stricture of ureter without hydronephrosis: Secondary | ICD-10-CM | POA: Insufficient documentation

## 2019-10-25 DIAGNOSIS — N2889 Other specified disorders of kidney and ureter: Secondary | ICD-10-CM | POA: Diagnosis not present

## 2019-10-25 MED ORDER — TECHNETIUM TC 99M MERTIATIDE
5.1000 | Freq: Once | INTRAVENOUS | Status: AC
  Administered 2019-10-25: 5.1 via INTRAVENOUS

## 2019-10-25 MED ORDER — FUROSEMIDE 10 MG/ML IJ SOLN: 25.0000 mg | Freq: Once | INTRAMUSCULAR | Status: AC

## 2019-10-25 MED ORDER — FUROSEMIDE 10 MG/ML IJ SOLN
INTRAMUSCULAR | Status: AC
  Administered 2019-10-25: 25 mg via INTRAVENOUS
  Filled 2019-10-25: qty 4

## 2019-11-03 DIAGNOSIS — N131 Hydronephrosis with ureteral stricture, not elsewhere classified: Secondary | ICD-10-CM | POA: Diagnosis not present

## 2019-11-20 DIAGNOSIS — N809 Endometriosis, unspecified: Secondary | ICD-10-CM | POA: Diagnosis not present

## 2019-11-22 ENCOUNTER — Other Ambulatory Visit: Payer: Self-pay

## 2019-11-23 ENCOUNTER — Other Ambulatory Visit: Payer: Self-pay

## 2019-11-23 ENCOUNTER — Encounter: Payer: Self-pay | Admitting: Family Medicine

## 2019-11-23 ENCOUNTER — Ambulatory Visit (INDEPENDENT_AMBULATORY_CARE_PROVIDER_SITE_OTHER): Payer: BC Managed Care – PPO | Admitting: Family Medicine

## 2019-11-23 VITALS — BP 110/80 | HR 88 | Temp 97.3°F | Resp 16 | Ht 65.0 in | Wt 119.0 lb

## 2019-11-23 DIAGNOSIS — Z0000 Encounter for general adult medical examination without abnormal findings: Secondary | ICD-10-CM

## 2019-11-23 DIAGNOSIS — Z130 Encounter for screening for diseases of the blood and blood-forming organs and certain disorders involving the immune mechanism: Secondary | ICD-10-CM

## 2019-11-23 DIAGNOSIS — Z1329 Encounter for screening for other suspected endocrine disorder: Secondary | ICD-10-CM | POA: Diagnosis not present

## 2019-11-23 DIAGNOSIS — Z131 Encounter for screening for diabetes mellitus: Secondary | ICD-10-CM | POA: Diagnosis not present

## 2019-11-23 DIAGNOSIS — Z13220 Encounter for screening for lipoid disorders: Secondary | ICD-10-CM

## 2019-11-23 LAB — COMPREHENSIVE METABOLIC PANEL
ALT: 7 U/L (ref 0–35)
AST: 11 U/L (ref 0–37)
Albumin: 4.5 g/dL (ref 3.5–5.2)
Alkaline Phosphatase: 49 U/L (ref 39–117)
BUN: 10 mg/dL (ref 6–23)
CO2: 27 mEq/L (ref 19–32)
Calcium: 9.6 mg/dL (ref 8.4–10.5)
Chloride: 105 mEq/L (ref 96–112)
Creatinine, Ser: 0.58 mg/dL (ref 0.40–1.20)
GFR: 142.46 mL/min (ref >60.00)
Glucose, Bld: 90 mg/dL (ref 70–99)
Potassium: 3.8 mEq/L (ref 3.5–5.1)
Sodium: 140 mEq/L (ref 135–145)
Total Bilirubin: 0.2 mg/dL (ref 0.2–1.2)
Total Protein: 7.4 g/dL (ref 6.0–8.3)

## 2019-11-23 LAB — LIPID PANEL
Cholesterol: 140 mg/dL (ref 0–200)
HDL: 34.6 mg/dL — ABNORMAL LOW (ref >39.00)
LDL Cholesterol: 95 mg/dL (ref 0–99)
NonHDL: 105.89
Total CHOL/HDL Ratio: 4
Triglycerides: 56 mg/dL (ref 0.0–149.0)
VLDL: 11.2 mg/dL (ref 0.0–40.0)

## 2019-11-23 LAB — CBC
HCT: 37.3 % (ref 36.0–46.0)
Hemoglobin: 12.4 g/dL (ref 12.0–15.0)
MCHC: 33.3 g/dL (ref 30.0–36.0)
MCV: 86.7 fl (ref 78.0–100.0)
Platelets: 274 10*3/uL (ref 150.0–400.0)
RBC: 4.3 Mil/uL (ref 3.87–5.11)
RDW: 13.6 % (ref 11.5–15.5)
WBC: 5.1 10*3/uL (ref 4.0–10.5)

## 2019-11-23 LAB — TSH: TSH: 1.32 u[IU]/mL (ref 0.35–4.50)

## 2019-11-23 NOTE — Patient Instructions (Addendum)
It was great to see you again today, I will be in touch with your labs as soon as possible Let me know if you need anything, otherwise we can plan for a physical in one year. I hope that the worst is now behind you!    Health Maintenance, Female Adopting a healthy lifestyle and getting preventive care are important in promoting health and wellness. Ask your health care provider about:  The right schedule for you to have regular tests and exams.  Things you can do on your own to prevent diseases and keep yourself healthy. What should I know about diet, weight, and exercise? Eat a healthy diet   Eat a diet that includes plenty of vegetables, fruits, low-fat dairy products, and lean protein.  Do not eat a lot of foods that are high in solid fats, added sugars, or sodium. Maintain a healthy weight Body mass index (BMI) is used to identify weight problems. It estimates body fat based on height and weight. Your health care provider can help determine your BMI and help you achieve or maintain a healthy weight. Get regular exercise Get regular exercise. This is one of the most important things you can do for your health. Most adults should:  Exercise for at least 150 minutes each week. The exercise should increase your heart rate and make you sweat (moderate-intensity exercise).  Do strengthening exercises at least twice a week. This is in addition to the moderate-intensity exercise.  Spend less time sitting. Even light physical activity can be beneficial. Watch cholesterol and blood lipids Have your blood tested for lipids and cholesterol at 36 years of age, then have this test every 5 years. Have your cholesterol levels checked more often if:  Your lipid or cholesterol levels are high.  You are older than 36 years of age.  You are at high risk for heart disease. What should I know about cancer screening? Depending on your health history and family history, you may need to have cancer  screening at various ages. This may include screening for:  Breast cancer.  Cervical cancer.  Colorectal cancer.  Skin cancer.  Lung cancer. What should I know about heart disease, diabetes, and high blood pressure? Blood pressure and heart disease  High blood pressure causes heart disease and increases the risk of stroke. This is more likely to develop in people who have high blood pressure readings, are of African descent, or are overweight.  Have your blood pressure checked: ? Every 3-5 years if you are 30-49 years of age. ? Every year if you are 2 years old or older. Diabetes Have regular diabetes screenings. This checks your fasting blood sugar level. Have the screening done:  Once every three years after age 63 if you are at a normal weight and have a low risk for diabetes.  More often and at a younger age if you are overweight or have a high risk for diabetes. What should I know about preventing infection? Hepatitis B If you have a higher risk for hepatitis B, you should be screened for this virus. Talk with your health care provider to find out if you are at risk for hepatitis B infection. Hepatitis C Testing is recommended for:  Everyone born from [birth year] through 1967.  Anyone with known risk factors for hepatitis C. Sexually transmitted infections (STIs)  Get screened for STIs, including gonorrhea and chlamydia, if: ? You are sexually active and are younger than 36 years of age. ? You are older  than 36 years of age and your health care provider tells you that you are at risk for this type of infection. ? Your sexual activity has changed since you were last screened, and you are at increased risk for chlamydia or gonorrhea. Ask your health care provider if you are at risk.  Ask your health care provider about whether you are at high risk for HIV. Your health care provider may recommend a prescription medicine to help prevent HIV infection. If you choose to take  medicine to prevent HIV, you should first get tested for HIV. You should then be tested every 3 months for as long as you are taking the medicine. Pregnancy  If you are about to stop having your period (premenopausal) and you may become pregnant, seek counseling before you get pregnant.  Take 400 to 800 micrograms (mcg) of folic acid every day if you become pregnant.  Ask for birth control (contraception) if you want to prevent pregnancy. Osteoporosis and menopause Osteoporosis is a disease in which the bones lose minerals and strength with aging. This can result in bone fractures. If you are 84 years old or older, or if you are at risk for osteoporosis and fractures, ask your health care provider if you should:  Be screened for bone loss.  Take a calcium or vitamin D supplement to lower your risk of fractures.  Be given hormone replacement therapy (HRT) to treat symptoms of menopause. Follow these instructions at home: Lifestyle  Do not use any products that contain nicotine or tobacco, such as cigarettes, e-cigarettes, and chewing tobacco. If you need help quitting, ask your health care provider.  Do not use street drugs.  Do not share needles.  Ask your health care provider for help if you need support or information about quitting drugs. Alcohol use  Do not drink alcohol if: ? Your health care provider tells you not to drink. ? You are pregnant, may be pregnant, or are planning to become pregnant.  If you drink alcohol: ? Limit how much you use to 0-1 drink a day. ? Limit intake if you are breastfeeding.  Be aware of how much alcohol is in your drink. In the [place] one drink equals one 12 oz bottle of beer (355 mL), one 5 oz glass of wine (148 mL), or one 1 oz glass of hard liquor (44 mL). General instructions  Schedule regular health, dental, and eye exams.  Stay current with your vaccines.  Tell your health care provider if: ? You often feel depressed. ? You have  ever been abused or do not feel safe at home. Summary  Adopting a healthy lifestyle and getting preventive care are important in promoting health and wellness.  Follow your health care provider's instructions about healthy diet, exercising, and getting tested or screened for diseases.  Follow your health care provider's instructions on monitoring your cholesterol and blood pressure. This information is not intended to replace advice given to you by your health care provider. Make sure you discuss any questions you have with your health care provider. Document Revised: 09/07/2018 Document Reviewed: 09/07/2018 Elsevier Patient Education  2020 [employer].

## 2019-11-23 NOTE — Progress Notes (Addendum)
[facility] [address], [place], [place] [address] [phone]  Date:  11/23/2019   Name:  [name]   DOB:  1986/01/03   MRN:  [id number]  PCP:  Darreld Mclean, MD    Chief Complaint: Annual Exam   History of Present Illness:  [name] is a 34 y.o. 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 Louis Meckel.  She underwent cystoscopy with pyelograms and ureteral dilatation/ stenting in November Flu vaccine- done already in Novemeber Pap is up-to-date-her GYN is Dr. Garwin Brothers- she had a hyst this past July for endometriosis, done at [facility] as it was a very complicated operation  She was in the hospital for 6 days.   She seems to be doing well following this operation, we hope that she will have less issues with her urinary system after her hysterectomy Can do complete labs today She does have an insurance form that needs to be faxed in once her cholesterol is back  She is getting monthly shots of Lupaneta for 6 months or so Patient Active Problem List   Diagnosis Date Noted  . Endometriosis 01/09/2019  . Hydroureter 01/09/2019  . Bilateral hydronephrosis   . Intractable pain   . Dysmenorrhea 02/13/2012  . ALLERGIC RHINITIS 09/03/2009  . Asthma 09/03/2009  . DEEP VENOUS THROMBOPHLEBITIS, LEG, RIGHT 07/27/2007  . ADULT RESPIRATORY DISTRESS SYNDROME 07/27/2007    Past Medical History:  Diagnosis Date  . Asthma    no inhaler used  . Bilateral leg numbness 05/24/2007  . Bronchitis, asthmatic   . DVT (deep venous thrombosis), right 2007  . Endometriosis   . FHx: cancer   . FHx:  diabetes mellitus   . FHx: heart disease   . FHx: kidney disease   . FHx: migraine headaches   . FHx: stroke   . FHx: thyroid disease   . H/O acute respiratory distress syndrome 2007   With a history of septic shock due to bladder infection  . H/O dysmenorrhea 2005  . H/O pyelonephritis   . H/O septic shock 05/2006  . H/O varicella   . History of kidney stones   . Hoarseness of voice    intermittent  . Hx: UTI (urinary tract infection)    x 1  . Low BMI 2007   During pregnancy  . Postpartum care following vaginal delivery (3/28) 12/24/2013  . Yeast infection     Past Surgical History:  Procedure Laterality Date  . BALLOON DILATION Left 08/18/2019   Procedure: LEFT URETERAL  DILATION;  Surgeon: Ardis Hughs, MD;  Location: [facility];  Service: Urology;  Laterality: Left;  . CYSTOSCOPY WITH RETROGRADE PYELOGRAM, URETEROSCOPY AND STENT PLACEMENT Bilateral 08/18/2019   Procedure: CYSTOSCOPY WITH BILATERAL  RETROGRADE PYELOGRAM, LEFT DIAGNOSTICURETEROSCOPY AND LEFT  STENT PLACEMENT;  Surgeon: Ardis Hughs, MD;  Location: [facility];  Service: Urology;  Laterality: Bilateral;  . PERCUTANEOUS NEPHROSTOMY     2007 due to pyelonephritis  . RADICAL HYSTERECTOMY  04/28/2019   cervix removed has ovaries, removed 1 fallopian tube  . WISDOM TOOTH  EXTRACTION     x 4    Social History   Tobacco Use  . Smoking status: Never Smoker  . Smokeless tobacco: Never Used  Substance Use Topics  . Alcohol use: Yes    Comment: occ  . Drug use: No    Family History  Problem Relation Age of Onset  . Diabetes Maternal Grandmother   . Heart disease Maternal Grandmother        heart attack  . COPD Maternal Grandmother        bronchitis  . Hypertension Father   . Hyperlipidemia Father   . Diabetes Father   . Fibroids Mother        had hyst  . Hypertension Mother   . Hyperlipidemia Mother   . Diabetes Mother   . Fibroids Paternal Aunt   .  Mental retardation Paternal Aunt   . Cancer Paternal Grandfather        brain tumor  . Hypertension Maternal Grandfather   . Stroke Maternal Grandfather   . Kidney disease Maternal Grandfather        dialysis    Allergies  Allergen Reactions  . Amoxicillin Hives    Did it involve swelling of the face/tongue/throat, SOB, or low BP? No Did it involve sudden or severe rash/hives, skin peeling, or any reaction on the inside of your mouth or nose? No Did you need to seek medical attention at a hospital or doctor's office? No When did it last happen?as a child If all above answers are "NO", may proceed with cephalosporin use.   . Lactose Intolerance (Gi)     Medication list has been reviewed and updated.  Current Outpatient Medications on File Prior to Visit  Medication Sig Dispense Refill  . levocetirizine (XYZAL) 5 MG tablet Take 5 mg by mouth at bedtime.     . montelukast (SINGULAIR) 10 MG tablet Take 10 mg by mouth at bedtime.     Marland Kitchen oxyCODONE (OXY IR/ROXICODONE) 5 MG immediate release tablet Take 1 tablet (5 mg total) by mouth every 4 (four) hours as needed for moderate pain. (Patient not taking: Reported on 11/23/2019) 30 tablet 0   No current facility-administered medications on file prior to visit.    Review of Systems:  As per HPI- otherwise negative.   Physical Examination: Vitals:   11/23/19 1051  BP: 110/80  Pulse: 88  Resp: 16  Temp: (!) 97.3 F (36.3 C)  SpO2: 99%   Vitals:   11/23/19 1051  Weight: 119 lb (54 kg)  Height: 5\' 5"  (1.651 m)   Body mass index is 19.8 kg/m. Ideal Body Weight: Weight in (lb) to have BMI = 25: 149.9  GEN: no acute distress.  Slim build, looks well HEENT: Atraumatic, Normocephalic.   TM wnl bilaterally  Ears and Nose: No external deformity. CV: RRR, No M/G/R. No JVD. No thrill. No extra heart sounds. PULM: CTA B, no wheezes, crackles, rhonchi. No retractions. No resp. distress. No accessory muscle use. ABD: S, NT, ND,  +BS. No rebound. No HSM. EXTR: No c/c/e PSYCH: Normally interactive. Conversant.   Waist 28 inches  Assessment and Plan: Physical exam  Screening for diabetes mellitus - Plan: Comprehensive metabolic panel, Hemoglobin A1c  Screening for deficiency anemia - Plan: CBC  Screening for hyperlipidemia - Plan: Lipid panel  Screening for thyroid disorder - Plan: TSH  CPE today  Currently doing well, she has unfortunately suffered from severe endometriosis with urinary complications.  She recently had a hysterectomy and  also a ureter stent.  Currently stable Will plan further follow- up pending labs. Fax in insurance form once cholesterol results return This visit occurred during the SARS-CoV-2 public health emergency.  Safety protocols were in place, including screening questions prior to the visit, additional usage of staff PPE, and extensive cleaning of exam room while observing appropriate contact time as indicated for disinfecting solutions.    Signed Lamar Blinks, MD  Received her labs 2/26 I have asked my assistant to complete her form and fax it for patient Message to patient  Results for orders placed or performed in visit on 11/23/19  CBC  Result Value Ref Range   WBC 5.1 4.0 - 10.5 K/uL   RBC 4.30 3.87 - 5.11 Mil/uL   Platelets 274.0 150.0 - 400.0 K/uL   Hemoglobin 12.4 12.0 - 15.0 g/dL   HCT 37.3 36.0 - 46.0 %   MCV 86.7 78.0 - 100.0 fl   MCHC 33.3 30.0 - 36.0 g/dL   RDW 13.6 11.5 - 15.5 %  Comprehensive metabolic panel  Result Value Ref Range   Sodium 140 135 - 145 mEq/L   Potassium 3.8 3.5 - 5.1 mEq/L   Chloride 105 96 - 112 mEq/L   CO2 27 19 - 32 mEq/L   Glucose, Bld 90 70 - 99 mg/dL   BUN 10 6 - 23 mg/dL   Creatinine, Ser 0.58 0.40 - 1.20 mg/dL   Total Bilirubin 0.2 0.2 - 1.2 mg/dL   Alkaline Phosphatase 49 39 - 117 U/L   AST 11 0 - 37 U/L   ALT 7 0 - 35 U/L   Total Protein 7.4 6.0 - 8.3 g/dL   Albumin 4.5 3.5 - 5.2 g/dL   GFR 142.46 >60.00 mL/min    Calcium 9.6 8.4 - 10.5 mg/dL  Hemoglobin A1c  Result Value Ref Range   Hgb A1c MFr Bld 5.6 4.6 - 6.5 %  Lipid panel  Result Value Ref Range   Cholesterol 140 0 - 200 mg/dL   Triglycerides 56.0 0.0 - 149.0 mg/dL   HDL 34.60 (L) >39.00 mg/dL   VLDL 11.2 0.0 - 40.0 mg/dL   LDL Cholesterol 95 0 - 99 mg/dL   Total CHOL/HDL Ratio 4    NonHDL 105.89   TSH  Result Value Ref Range   TSH 1.32 0.35 - 4.50 uIU/mL

## 2019-11-24 ENCOUNTER — Encounter: Payer: Self-pay | Admitting: Family Medicine

## 2019-11-24 LAB — HEMOGLOBIN A1C: Hgb A1c MFr Bld: 5.6 % (ref 4.6–6.5)

## 2019-12-15 DIAGNOSIS — J301 Allergic rhinitis due to pollen: Secondary | ICD-10-CM | POA: Diagnosis not present

## 2019-12-15 DIAGNOSIS — J3081 Allergic rhinitis due to animal (cat) (dog) hair and dander: Secondary | ICD-10-CM | POA: Diagnosis not present

## 2019-12-15 DIAGNOSIS — H1045 Other chronic allergic conjunctivitis: Secondary | ICD-10-CM | POA: Diagnosis not present

## 2019-12-15 DIAGNOSIS — J4520 Mild intermittent asthma, uncomplicated: Secondary | ICD-10-CM | POA: Diagnosis not present

## 2019-12-18 DIAGNOSIS — N809 Endometriosis, unspecified: Secondary | ICD-10-CM | POA: Diagnosis not present

## 2020-01-15 DIAGNOSIS — N809 Endometriosis, unspecified: Secondary | ICD-10-CM | POA: Diagnosis not present

## 2020-04-29 DIAGNOSIS — R311 Benign essential microscopic hematuria: Secondary | ICD-10-CM | POA: Diagnosis not present

## 2020-05-06 DIAGNOSIS — N135 Crossing vessel and stricture of ureter without hydronephrosis: Secondary | ICD-10-CM | POA: Diagnosis not present

## 2020-05-06 DIAGNOSIS — R311 Benign essential microscopic hematuria: Secondary | ICD-10-CM | POA: Diagnosis not present

## 2020-06-25 DIAGNOSIS — Z01419 Encounter for gynecological examination (general) (routine) without abnormal findings: Secondary | ICD-10-CM | POA: Diagnosis not present

## 2020-06-25 DIAGNOSIS — Z90710 Acquired absence of both cervix and uterus: Secondary | ICD-10-CM | POA: Diagnosis not present

## 2020-06-25 DIAGNOSIS — G479 Sleep disorder, unspecified: Secondary | ICD-10-CM | POA: Diagnosis not present

## 2020-06-25 DIAGNOSIS — R232 Flushing: Secondary | ICD-10-CM | POA: Diagnosis not present

## 2020-09-10 DIAGNOSIS — R232 Flushing: Secondary | ICD-10-CM | POA: Diagnosis not present

## 2020-11-22 NOTE — Progress Notes (Addendum)
[facility] [address], [place], [place] [address] [phone]  Date:  11/25/2020   Name:  [name]   DOB:  1985-11-26   MRN:  [id number]  PCP:  Darreld Mclean, MD    Chief Complaint: Abdominal Pain   History of Present Illness:  [name] is a 35 y.o. 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 She is s/p hysterectomy for endometriosis   Labs/ hep C screening- update today Fu vaccine- done  Pap UTD Mammo-no family history, she plans to start at age 78  Her allergies are somewhat bothersome  She sees an allergidst- Dr Delice Bison   She has noted left shoulder pain which started 2 years ago- has waxed and waned.  She notes the pain started when her arm was pulled forcibly, she was catching one of her children who stumbled while walking up steps Since that time the pain will sometimes go away, but then returns She has pain at night No weakness or difficulty moving the arm No CP or SOB  Symptoms are not worse with cardiovascular exercise  Patient Active Problem List   Diagnosis Date Noted  . Endometriosis 01/09/2019  . Hydroureter 01/09/2019  . Bilateral hydronephrosis   . Intractable pain   . Dysmenorrhea 02/13/2012  . ALLERGIC RHINITIS 09/03/2009  . Asthma 09/03/2009  . DEEP VENOUS THROMBOPHLEBITIS, LEG, RIGHT 07/27/2007  . ADULT RESPIRATORY DISTRESS SYNDROME 07/27/2007    Past Medical History:  Diagnosis Date  . Asthma    no inhaler used  . Bilateral leg numbness 05/24/2007   . Bronchitis, asthmatic   . DVT (deep venous thrombosis), right 2007  . Endometriosis   . FHx: cancer   . FHx: diabetes mellitus   . FHx: heart disease   . FHx: kidney disease   . FHx: migraine headaches   . FHx: stroke   . FHx: thyroid disease   . H/O acute respiratory distress syndrome 2007   With a history of septic shock due to bladder infection  . H/O dysmenorrhea 2005  . H/O pyelonephritis   . H/O septic shock 05/2006  . H/O varicella   . History of kidney stones   . Hoarseness of voice    intermittent  . Hx: UTI (urinary tract infection)    x 1  . Low BMI 2007   During pregnancy  . Postpartum care following vaginal delivery (3/28) 12/24/2013  . Yeast infection     Past Surgical History:  Procedure Laterality Date  . BALLOON DILATION Left 08/18/2019   Procedure: LEFT URETERAL  DILATION;  Surgeon: Ardis Hughs, MD;  Location: [facility];  Service: Urology;  Laterality: Left;  . CYSTOSCOPY WITH RETROGRADE PYELOGRAM, URETEROSCOPY AND STENT PLACEMENT Bilateral 08/18/2019   Procedure: CYSTOSCOPY WITH BILATERAL  RETROGRADE PYELOGRAM, LEFT DIAGNOSTICURETEROSCOPY AND LEFT  STENT PLACEMENT;  Surgeon: Ardis Hughs, MD;  Location: [facility];  Service: Urology;  Laterality: Bilateral;  . PERCUTANEOUS NEPHROSTOMY  2007 due to pyelonephritis  . RADICAL HYSTERECTOMY  04/28/2019   cervix removed has ovaries, removed 1 fallopian tube  . WISDOM TOOTH EXTRACTION     x 4    Social History   Tobacco Use  . Smoking status: Never Smoker  . Smokeless tobacco: Never Used  Vaping Use  . Vaping Use: Never used  Substance Use Topics  . Alcohol use: Yes    Comment: occ  . Drug use: No    Family History  Problem Relation Age of Onset  . Diabetes Maternal Grandmother   . Heart disease Maternal Grandmother        heart attack  . COPD Maternal Grandmother        bronchitis  . Hypertension Father   . Hyperlipidemia Father   .  Diabetes Father   . Fibroids Mother        had hyst  . Hypertension Mother   . Hyperlipidemia Mother   . Diabetes Mother   . Fibroids Paternal Aunt   . Mental retardation Paternal Aunt   . Cancer Paternal Grandfather        brain tumor  . Hypertension Maternal Grandfather   . Stroke Maternal Grandfather   . Kidney disease Maternal Grandfather        dialysis    Allergies  Allergen Reactions  . Amoxicillin Hives    Did it involve swelling of the face/tongue/throat, SOB, or low BP? No Did it involve sudden or severe rash/hives, skin peeling, or any reaction on the inside of your mouth or nose? No Did you need to seek medical attention at a hospital or doctor's office? No When did it last happen?as a child If all above answers are "NO", may proceed with cephalosporin use.   . Lactose Intolerance (Gi)     Medication list has been reviewed and updated.  Current Outpatient Medications on File Prior to Visit  Medication Sig Dispense Refill  . levocetirizine (XYZAL) 5 MG tablet Take 5 mg by mouth at bedtime.     . montelukast (SINGULAIR) 10 MG tablet Take 10 mg by mouth at bedtime.     . norethindrone (AYGESTIN) 5 MG tablet Take 5 mg by mouth daily.     No current facility-administered medications on file prior to visit.    Review of Systems:  As per HPI- otherwise negative.   Physical Examination: Vitals:   11/25/20 0821  BP: 108/72  Pulse: 89  Resp: 16  SpO2: 93%   Vitals:   11/25/20 0821  Weight: 136 lb (61.7 kg)  Height: 5\' 5"  (1.651 m)   Body mass index is 22.63 kg/m. Ideal Body Weight: Weight in (lb) to have BMI = 25: 149.9  GEN: no acute distress. HEENT: Atraumatic, Normocephalic.  Ears and Nose: No external deformity. CV: RRR, No M/G/R. No JVD. No thrill. No extra heart sounds. PULM: CTA B, no wheezes, crackles, rhonchi. No retractions. No resp. distress. No accessory muscle use. ABD: S, NT, ND, +BS. No rebound. No HSM. EXTR: No c/c/e PSYCH:  Normally interactive. Conversant.  Left shoulder full ROM, non tender except some pain with internal rotation and ECT  Assessment and Plan: Physical exam  Screening for diabetes mellitus - Plan: Comprehensive metabolic panel, Hemoglobin A1c  Screening for deficiency anemia - Plan: CBC  Screening for hyperlipidemia - Plan: Lipid panel  Screening for thyroid disorder - Plan: TSH  Fatigue, unspecified type - Plan: TSH, VITAMIN D 25 Hydroxy (Vit-D Deficiency, Fractures)  Encounter for hepatitis C  screening test for low risk patient - Plan: Hepatitis C antibody  Chronic left shoulder pain - Plan: Ambulatory referral to Orthopedic Surgery  CPE today Encouraged healthy diet and exercise routine Labs pending as above Discussed mammo IUTD Has GYN care Ortho referral for her shoulder pain which has been persistent over 2 years This visit occurred during the SARS-CoV-2 public health emergency.  Safety protocols were in place, including screening questions prior to the visit, additional usage of staff PPE, and extensive cleaning of exam room while observing appropriate contact time as indicated for disinfecting solutions.    Signed Lamar Blinks, MD   Received her labs as below, message to patient  Results for orders placed or performed in visit on 11/25/20  CBC  Result Value Ref Range   WBC 6.2 4.0 - 10.5 K/uL   RBC 4.26 3.87 - 5.11 Mil/uL   Platelets 270.0 150.0 - 400.0 K/uL   Hemoglobin 12.6 12.0 - 15.0 g/dL   HCT 37.1 36.0 - 46.0 %   MCV 87.1 78.0 - 100.0 fl   MCHC 33.9 30.0 - 36.0 g/dL   RDW 13.4 11.5 - 15.5 %  Comprehensive metabolic panel  Result Value Ref Range   Sodium 139 135 - 145 mEq/L   Potassium 3.9 3.5 - 5.1 mEq/L   Chloride 108 96 - 112 mEq/L   CO2 23 19 - 32 mEq/L   Glucose, Bld 97 70 - 99 mg/dL   BUN 9 6 - 23 mg/dL   Creatinine, Ser 0.53 0.40 - 1.20 mg/dL   Total Bilirubin 0.3 0.2 - 1.2 mg/dL   Alkaline Phosphatase 55 39 - 117 U/L   AST 11 0 - 37 U/L    ALT 13 0 - 35 U/L   Total Protein 7.0 6.0 - 8.3 g/dL   Albumin 4.2 3.5 - 5.2 g/dL   GFR 118.63 >60.00 mL/min   Calcium 9.4 8.4 - 10.5 mg/dL  Hemoglobin A1c  Result Value Ref Range   Hgb A1c MFr Bld 5.7 4.6 - 6.5 %  Lipid panel  Result Value Ref Range   Cholesterol 120 0 - 200 mg/dL   Triglycerides 43.0 0.0 - 149.0 mg/dL   HDL 31.80 (L) >39.00 mg/dL   VLDL 8.6 0.0 - 40.0 mg/dL   LDL Cholesterol 80 0 - 99 mg/dL   Total CHOL/HDL Ratio 4    NonHDL 88.16   TSH  Result Value Ref Range   TSH 1.12 0.35 - 4.50 uIU/mL  VITAMIN D 25 Hydroxy (Vit-D Deficiency, Fractures)  Result Value Ref Range   VITD 38.09 30.00 - 100.00 ng/mL     .

## 2020-11-22 NOTE — Patient Instructions (Signed)
Great to see you again today- I will be in touch with your labs  Keep up the good work!   mammo age 35 We will set you up to see orthopedics about your shoulder pain  Assuming all is well we can visit in one year   Health Maintenance, Female Adopting a healthy lifestyle and getting preventive care are important in promoting health and wellness. Ask your health care provider about:  The right schedule for you to have regular tests and exams.  Things you can do on your own to prevent diseases and keep yourself healthy. What should I know about diet, weight, and exercise? Eat a healthy diet  Eat a diet that includes plenty of vegetables, fruits, low-fat dairy products, and lean protein.  Do not eat a lot of foods that are high in solid fats, added sugars, or sodium.   Maintain a healthy weight Body mass index (BMI) is used to identify weight problems. It estimates body fat based on height and weight. Your health care provider can help determine your BMI and help you achieve or maintain a healthy weight. Get regular exercise Get regular exercise. This is one of the most important things you can do for your health. Most adults should:  Exercise for at least 150 minutes each week. The exercise should increase your heart rate and make you sweat (moderate-intensity exercise).  Do strengthening exercises at least twice a week. This is in addition to the moderate-intensity exercise.  Spend less time sitting. Even light physical activity can be beneficial. Watch cholesterol and blood lipids Have your blood tested for lipids and cholesterol at 37 years of age, then have this test every 5 years. Have your cholesterol levels checked more often if:  Your lipid or cholesterol levels are high.  You are older than 37 years of age.  You are at high risk for heart disease. What should I know about cancer screening? Depending on your health history and family history, you may need to have cancer  screening at various ages. This may include screening for:  Breast cancer.  Cervical cancer.  Colorectal cancer.  Skin cancer.  Lung cancer. What should I know about heart disease, diabetes, and high blood pressure? Blood pressure and heart disease  High blood pressure causes heart disease and increases the risk of stroke. This is more likely to develop in people who have high blood pressure readings, are of African descent, or are overweight.  Have your blood pressure checked: ? Every 3-5 years if you are 44-65 years of age. ? Every year if you are 28 years old or older. Diabetes Have regular diabetes screenings. This checks your fasting blood sugar level. Have the screening done:  Once every three years after age 55 if you are at a normal weight and have a low risk for diabetes.  More often and at a younger age if you are overweight or have a high risk for diabetes. What should I know about preventing infection? Hepatitis B If you have a higher risk for hepatitis B, you should be screened for this virus. Talk with your health care provider to find out if you are at risk for hepatitis B infection. Hepatitis C Testing is recommended for:  Everyone born from [birth year] through 1967.  Anyone with known risk factors for hepatitis C. Sexually transmitted infections (STIs)  Get screened for STIs, including gonorrhea and chlamydia, if: ? You are sexually active and are younger than 37 years of age. ? You  are older than 37 years of age and your health care provider tells you that you are at risk for this type of infection. ? Your sexual activity has changed since you were last screened, and you are at increased risk for chlamydia or gonorrhea. Ask your health care provider if you are at risk.  Ask your health care provider about whether you are at high risk for HIV. Your health care provider may recommend a prescription medicine to help prevent HIV infection. If you choose to take  medicine to prevent HIV, you should first get tested for HIV. You should then be tested every 3 months for as long as you are taking the medicine. Pregnancy  If you are about to stop having your period (premenopausal) and you may become pregnant, seek counseling before you get pregnant.  Take 400 to 800 micrograms (mcg) of folic acid every day if you become pregnant.  Ask for birth control (contraception) if you want to prevent pregnancy. Osteoporosis and menopause Osteoporosis is a disease in which the bones lose minerals and strength with aging. This can result in bone fractures. If you are 8 years old or older, or if you are at risk for osteoporosis and fractures, ask your health care provider if you should:  Be screened for bone loss.  Take a calcium or vitamin D supplement to lower your risk of fractures.  Be given hormone replacement therapy (HRT) to treat symptoms of menopause. Follow these instructions at home: Lifestyle  Do not use any products that contain nicotine or tobacco, such as cigarettes, e-cigarettes, and chewing tobacco. If you need help quitting, ask your health care provider.  Do not use street drugs.  Do not share needles.  Ask your health care provider for help if you need support or information about quitting drugs. Alcohol use  Do not drink alcohol if: ? Your health care provider tells you not to drink. ? You are pregnant, may be pregnant, or are planning to become pregnant.  If you drink alcohol: ? Limit how much you use to 0-1 drink a day. ? Limit intake if you are breastfeeding.  Be aware of how much alcohol is in your drink. In the [place] one drink equals one 12 oz bottle of beer (355 mL), one 5 oz glass of wine (148 mL), or one 1 oz glass of hard liquor (44 mL). General instructions  Schedule regular health, dental, and eye exams.  Stay current with your vaccines.  Tell your health care provider if: ? You often feel depressed. ? You have  ever been abused or do not feel safe at home. Summary  Adopting a healthy lifestyle and getting preventive care are important in promoting health and wellness.  Follow your health care provider's instructions about healthy diet, exercising, and getting tested or screened for diseases.  Follow your health care provider's instructions on monitoring your cholesterol and blood pressure. This information is not intended to replace advice given to you by your health care provider. Make sure you discuss any questions you have with your health care provider. Document Revised: 09/07/2018 Document Reviewed: 09/07/2018 Elsevier Patient Education  2021 [employer].

## 2020-11-25 ENCOUNTER — Other Ambulatory Visit: Payer: Self-pay

## 2020-11-25 ENCOUNTER — Ambulatory Visit (INDEPENDENT_AMBULATORY_CARE_PROVIDER_SITE_OTHER): Payer: BC Managed Care – PPO | Admitting: Family Medicine

## 2020-11-25 ENCOUNTER — Encounter: Payer: Self-pay | Admitting: Family Medicine

## 2020-11-25 VITALS — BP 108/72 | HR 89 | Resp 16 | Ht 65.0 in | Wt 136.0 lb

## 2020-11-25 DIAGNOSIS — Z0000 Encounter for general adult medical examination without abnormal findings: Secondary | ICD-10-CM

## 2020-11-25 DIAGNOSIS — Z13220 Encounter for screening for lipoid disorders: Secondary | ICD-10-CM

## 2020-11-25 DIAGNOSIS — Z130 Encounter for screening for diseases of the blood and blood-forming organs and certain disorders involving the immune mechanism: Secondary | ICD-10-CM | POA: Diagnosis not present

## 2020-11-25 DIAGNOSIS — Z1329 Encounter for screening for other suspected endocrine disorder: Secondary | ICD-10-CM

## 2020-11-25 DIAGNOSIS — Z131 Encounter for screening for diabetes mellitus: Secondary | ICD-10-CM

## 2020-11-25 DIAGNOSIS — R5383 Other fatigue: Secondary | ICD-10-CM | POA: Diagnosis not present

## 2020-11-25 DIAGNOSIS — Z1159 Encounter for screening for other viral diseases: Secondary | ICD-10-CM | POA: Diagnosis not present

## 2020-11-25 DIAGNOSIS — G8929 Other chronic pain: Secondary | ICD-10-CM

## 2020-11-25 DIAGNOSIS — M25512 Pain in left shoulder: Secondary | ICD-10-CM

## 2020-11-25 LAB — CBC
HCT: 37.1 % (ref 36.0–46.0)
Hemoglobin: 12.6 g/dL (ref 12.0–15.0)
MCHC: 33.9 g/dL (ref 30.0–36.0)
MCV: 87.1 fl (ref 78.0–100.0)
Platelets: 270 10*3/uL (ref 150.0–400.0)
RBC: 4.26 Mil/uL (ref 3.87–5.11)
RDW: 13.4 % (ref 11.5–15.5)
WBC: 6.2 10*3/uL (ref 4.0–10.5)

## 2020-11-25 LAB — COMPREHENSIVE METABOLIC PANEL
ALT: 13 U/L (ref 0–35)
AST: 11 U/L (ref 0–37)
Albumin: 4.2 g/dL (ref 3.5–5.2)
Alkaline Phosphatase: 55 U/L (ref 39–117)
BUN: 9 mg/dL (ref 6–23)
CO2: 23 mEq/L (ref 19–32)
Calcium: 9.4 mg/dL (ref 8.4–10.5)
Chloride: 108 mEq/L (ref 96–112)
Creatinine, Ser: 0.53 mg/dL (ref 0.40–1.20)
GFR: 118.63 mL/min (ref >60.00)
Glucose, Bld: 97 mg/dL (ref 70–99)
Potassium: 3.9 mEq/L (ref 3.5–5.1)
Sodium: 139 mEq/L (ref 135–145)
Total Bilirubin: 0.3 mg/dL (ref 0.2–1.2)
Total Protein: 7 g/dL (ref 6.0–8.3)

## 2020-11-25 LAB — LIPID PANEL
Cholesterol: 120 mg/dL (ref 0–200)
HDL: 31.8 mg/dL — ABNORMAL LOW (ref >39.00)
LDL Cholesterol: 80 mg/dL (ref 0–99)
NonHDL: 88.16
Total CHOL/HDL Ratio: 4
Triglycerides: 43 mg/dL (ref 0.0–149.0)
VLDL: 8.6 mg/dL (ref 0.0–40.0)

## 2020-11-25 LAB — HEMOGLOBIN A1C: Hgb A1c MFr Bld: 5.7 % (ref 4.6–6.5)

## 2020-11-25 LAB — VITAMIN D 25 HYDROXY (VIT D DEFICIENCY, FRACTURES): VITD: 38.09 ng/mL (ref 30.00–100.00)

## 2020-11-25 LAB — TSH: TSH: 1.12 u[IU]/mL (ref 0.35–4.50)

## 2020-11-26 LAB — HEPATITIS C ANTIBODY
Hepatitis C Ab: NONREACTIVE
SIGNAL TO CUT-OFF: 0.01 (ref <1.00)

## 2020-12-03 ENCOUNTER — Ambulatory Visit: Payer: Self-pay

## 2020-12-03 ENCOUNTER — Ambulatory Visit: Payer: BC Managed Care – PPO | Admitting: Orthopaedic Surgery

## 2020-12-03 ENCOUNTER — Encounter: Payer: Self-pay | Admitting: Orthopaedic Surgery

## 2020-12-03 VITALS — Ht 65.0 in | Wt 131.0 lb

## 2020-12-03 DIAGNOSIS — G8929 Other chronic pain: Secondary | ICD-10-CM | POA: Diagnosis not present

## 2020-12-03 DIAGNOSIS — M25512 Pain in left shoulder: Secondary | ICD-10-CM | POA: Diagnosis not present

## 2020-12-03 NOTE — Progress Notes (Signed)
Office Visit Note   Patient: [name]           Date of Birth: [birth year]           MRN: [id number] Visit Date: 12/03/2020              Requested by: Darreld Mclean, MD [address] [place],  [place] [address] PCP: Darreld Mclean, MD   Assessment & Plan: Visit Diagnoses:  1. Chronic left shoulder pain     Plan: Impression is left shoulder SLAP tear.  Overall based on symptoms and the lack of improvement from rest she would like to try a cortisone injection.  We also discussed obtaining MRI showed she not receive any relief from the cortisone injection.  Questions encouraged and answered.  Follow-Up Instructions: Return if symptoms worsen or fail to improve.   Orders:  Orders Placed This Encounter  Procedures  . XR Shoulder Left   No orders of the defined types were placed in this encounter.     Procedures: No procedures performed   Clinical Data: No additional findings.   Subjective: Chief Complaint  Patient presents with  . Left Shoulder - Pain    [name] is a 35 year old female right-hand-dominant comes in for evaluation of left shoulder pain for 2 years on and off.  Referral from PCP Dr. Jackie Plum.  She had an injury 2 years ago when she reached back to grab her daughter and felt a pop in her left shoulder.  Since then she has had pain throughout her shoulder with throbbing and shooting pain.  Over-the-counter medicines do not help.  She has not had any previous left shoulder surgeries.  She currently works for [employer] [place].  Shoulder is not tender to touch.  Some days the pain is okay and other days not.   Review of Systems  Constitutional: Negative.   HENT: Negative.   Eyes: Negative.   Respiratory: Negative.   Cardiovascular: Negative.   Endocrine: Negative.   Musculoskeletal: Negative.   Neurological: Negative.   Hematological: Negative.   Psychiatric/Behavioral: Negative.   All other systems reviewed and are  negative.    Objective: Vital Signs: Ht 5\' 5"  (1.651 m)   Wt 131 lb (59.4 kg)   LMP 12/18/2016 Comment: 04-28-2019  BMI 21.80 kg/m   Physical Exam Vitals and nursing note reviewed.  Constitutional:      Appearance: She is well-developed and well-nourished.  HENT:     Head: Normocephalic and atraumatic.  Eyes:     Extraocular Movements: EOM normal.  Pulmonary:     Effort: Pulmonary effort is normal.  Abdominal:     Palpations: Abdomen is soft.  Musculoskeletal:     Cervical back: Neck supple.  Skin:    General: Skin is warm.     Capillary Refill: Capillary refill takes less than 2 seconds.  Neurological:     Mental Status: She is alert and oriented to person, place, and time.  Psychiatric:        Mood and Affect: Mood and affect normal.        Behavior: Behavior normal.        Thought Content: Thought content normal.        Judgment: Judgment normal.     Ortho Exam Left shoulder shows positive O'Brien.  Normal range of motion and contour.  Strength is normal to manual muscle testing.  Negative Neer and Hawkins.  Negative crank test. Specialty Comments:  No specialty comments available.  Imaging: XR Shoulder Left  Result Date: 12/03/2020 No acute or structural abnormalities    PMFS History: Patient Active Problem List   Diagnosis Date Noted  . Endometriosis 01/09/2019  . Hydroureter 01/09/2019  . Bilateral hydronephrosis   . Intractable pain   . Dysmenorrhea 02/13/2012  . ALLERGIC RHINITIS 09/03/2009  . Asthma 09/03/2009  . DEEP VENOUS THROMBOPHLEBITIS, LEG, RIGHT 07/27/2007  . ADULT RESPIRATORY DISTRESS SYNDROME 07/27/2007   Past Medical History:  Diagnosis Date  . Asthma    no inhaler used  . Bilateral leg numbness 05/24/2007  . Bronchitis, asthmatic   . DVT (deep venous thrombosis), right 2007  . Endometriosis   . FHx: cancer   . FHx: diabetes mellitus   . FHx: heart disease   . FHx: kidney disease   . FHx: migraine headaches   . FHx: stroke    . FHx: thyroid disease   . H/O acute respiratory distress syndrome 2007   With a history of septic shock due to bladder infection  . H/O dysmenorrhea 2005  . H/O pyelonephritis   . H/O septic shock 05/2006  . H/O varicella   . History of kidney stones   . Hoarseness of voice    intermittent  . Hx: UTI (urinary tract infection)    x 1  . Low BMI 2007   During pregnancy  . Postpartum care following vaginal delivery (3/28) 12/24/2013  . Yeast infection     Family History  Problem Relation Age of Onset  . Diabetes Maternal Grandmother   . Heart disease Maternal Grandmother        heart attack  . COPD Maternal Grandmother        bronchitis  . Hypertension Father   . Hyperlipidemia Father   . Diabetes Father   . Fibroids Mother        had hyst  . Hypertension Mother   . Hyperlipidemia Mother   . Diabetes Mother   . Fibroids Paternal Aunt   . Mental retardation Paternal Aunt   . Cancer Paternal Grandfather        brain tumor  . Hypertension Maternal Grandfather   . Stroke Maternal Grandfather   . Kidney disease Maternal Grandfather        dialysis    Past Surgical History:  Procedure Laterality Date  . BALLOON DILATION Left 08/18/2019   Procedure: LEFT URETERAL  DILATION;  Surgeon: Ardis Hughs, MD;  Location[facility];  Service: Urology;  Laterality: Left;  . CYSTOSCOPY WITH RETROGRADE PYELOGRAM, URETEROSCOPY AND STENT PLACEMENT Bilateral 08/18/2019   Procedure: CYSTOSCOPY WITH BILATERAL  RETROGRADE PYELOGRAM, LEFT DIAGNOSTICURETEROSCOPY AND LEFT  STENT PLACEMENT;  Surgeon: Ardis Hughs, MD;  Location: [facility];  Service: Urology;  Laterality: Bilateral;  . PERCUTANEOUS NEPHROSTOMY     2007 due to pyelonephritis  . RADICAL HYSTERECTOMY  04/28/2019   cervix removed has ovaries, removed 1 fallopian tube  . WISDOM TOOTH EXTRACTION     x 4   Social History   Occupational History  . Not on file  Tobacco Use  . Smoking  status: Never Smoker  . Smokeless tobacco: Never Used  Vaping Use  . Vaping Use: Never used  Substance and Sexual Activity  . Alcohol use: Yes    Comment: occ  . Drug use: No  . Sexual activity: Yes    Birth control/protection: I.U.D.

## 2020-12-03 NOTE — Progress Notes (Signed)
Subjective: Patient is here for ultrasound-guided intra-articular left glenohumeral injection.  Pain from labrum pathology.  Objective:  Pain with overhead reach today.  Procedure: Ultrasound guided injection is preferred based studies that show increased duration, increased effect, greater accuracy, decreased procedural pain, increased response rate, and decreased cost with ultrasound guided versus blind injection.   Verbal informed consent obtained.  Time-out conducted.  Noted no overlying erythema, induration, or other signs of local infection. Ultrasound-guided left glenohumeral injection: After sterile prep with Betadine, injected 4 cc 0.25% bupivocaine without epinephrine and 6 mg betamethasone using a 22-gauge spinal needle, passing the needle from posterior approach into the glenohumeral joint.  Injectate seen filling joint capsule.  Good immediate relief.

## 2021-01-15 DIAGNOSIS — J301 Allergic rhinitis due to pollen: Secondary | ICD-10-CM | POA: Diagnosis not present

## 2021-01-15 DIAGNOSIS — H1045 Other chronic allergic conjunctivitis: Secondary | ICD-10-CM | POA: Diagnosis not present

## 2021-01-15 DIAGNOSIS — J3081 Allergic rhinitis due to animal (cat) (dog) hair and dander: Secondary | ICD-10-CM | POA: Diagnosis not present

## 2021-01-15 DIAGNOSIS — J4520 Mild intermittent asthma, uncomplicated: Secondary | ICD-10-CM | POA: Diagnosis not present

## 2021-05-01 DIAGNOSIS — Z20822 Contact with and (suspected) exposure to covid-19: Secondary | ICD-10-CM | POA: Diagnosis not present

## 2021-05-09 DIAGNOSIS — N200 Calculus of kidney: Secondary | ICD-10-CM | POA: Diagnosis not present

## 2021-05-09 DIAGNOSIS — N134 Hydroureter: Secondary | ICD-10-CM | POA: Diagnosis not present

## 2021-08-29 IMAGING — NM NM RENAL IMAGING FLOW W/ PHARM
2 series · 12 of 12 positions shown · non-contrast
Comparison: CT 01/08/2019

CLINICAL DATA: Hysterectomy and endometriosis. Ureteral obstruction

EXAM:
NUCLEAR MEDICINE RENAL SCAN WITH DIURETIC ADMINISTRATION
TECHNIQUE: Radionuclide angiographic and sequential renal images were obtained
after intravenous injection of radiopharmaceutical. Imaging was
continued during slow intravenous injection of Lasix approximately
15 minutes after the start of the examination.
RADIOPHARMACEUTICALS:  4.8 mCi Xechnetium-55m MAG3 IV

[Series 1: renal scan · 4.14mm/px · 6 of 40 frames shown (1 of 2)]
[frame 4/40]
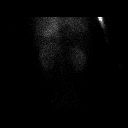
[frame 10/40]
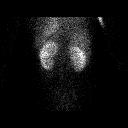
[frame 17/40]
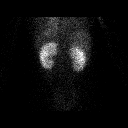
[frame 24/40]
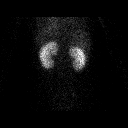
[frame 30/40]
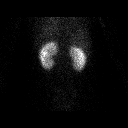
[frame 37/40]
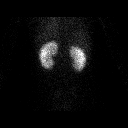

[Series 1: renal scan · 4.14mm/px · 6 of 90 frames shown (2 of 2)]
[frame 8/90]
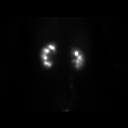
[frame 23/90]
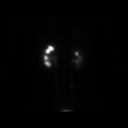
[frame 38/90]
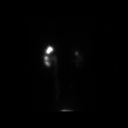
[frame 53/90]
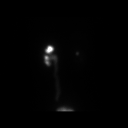
[frame 68/90]
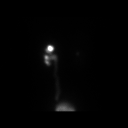
[frame 83/90]
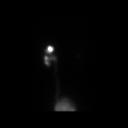

[12 of 12 positions shown; findings below may reference images not displayed]

FINDINGS: Flow:  Prompt symmetric arterial flow to the kidneys.

Left renogram: Uniform uptake of counts in the renal cortex. Counts
are promptly excreted into dilated renal calices. Lasix augments
clearance of counts from the LEFT renal calices and pelvis. mild
hydroureter. Mild postvoid residual within the dilated LEFT renal
calices.

Right renogram: Uniform uptake of counts in the renal cortex. Counts
are promptly excreted into the collecting system and cleared prior
to administration of Lasix. Lasix augment clearance. No postvoid
residual.

Differential:

Left kidney = 52 %

Right kidney = 47 %

T1/2 post Lasix :

Left kidney = 28 min

Right kidney = 5 min
IMPRESSION: 1. Partially obstructive hydronephrosis of the LEFT kidney and
ureter. Mild postvoid residual in the LEFT renal calices.
Hydroureter is suggested.
2. Normal RIGHT kidney.

## 2021-10-02 DIAGNOSIS — Z01419 Encounter for gynecological examination (general) (routine) without abnormal findings: Secondary | ICD-10-CM | POA: Diagnosis not present

## 2021-10-02 DIAGNOSIS — Z90710 Acquired absence of both cervix and uterus: Secondary | ICD-10-CM | POA: Diagnosis not present

## 2021-10-02 DIAGNOSIS — B3731 Acute candidiasis of vulva and vagina: Secondary | ICD-10-CM | POA: Diagnosis not present

## 2021-11-23 NOTE — Progress Notes (Deleted)
Therapist, music at [facility] ?[address], [address] ?[place], [place] [address] ?336 [phone] ?Fax [phone] ? ?Date:  11/26/2021  ? ?Name:  [name]   DOB:  Aug 21, 1986   MRN:  [id number] ? ?PCP:  Darreld Mclean, MD  ? ? ?Chief Complaint: No chief complaint on file. ? ? ?History of Present Illness: ? ?[name] is a 36 y.o. very pleasant female patient who presents with the following: ? ?Patient seen today for physical exam ?Most recent visit with myself was about 1 year ago-  history of, allergies, DVT 2007 due to immobilization and endometriosis, ureteral obstruction in late 2020 due to endometriosis ? ?She is now status post hysterectomy ? ?COVID booster ?Flu shot ?Pap-patient is status post hysterectomy.  Confirmed that cervix was removed ?Labs 1 year ago ?Patient Active Problem List  ? Diagnosis Date Noted  ? Endometriosis 01/09/2019  ? Hydroureter 01/09/2019  ? Bilateral hydronephrosis   ? Intractable pain   ? Dysmenorrhea 02/13/2012  ? ALLERGIC RHINITIS 09/03/2009  ? Asthma 09/03/2009  ? DEEP VENOUS THROMBOPHLEBITIS, LEG, RIGHT 07/27/2007  ? ADULT RESPIRATORY DISTRESS SYNDROME 07/27/2007  ? ? ?Past Medical History:  ?Diagnosis Date  ? Asthma   ? no inhaler used  ? Bilateral leg numbness 05/24/2007  ? Bronchitis, asthmatic   ? DVT (deep venous thrombosis), right 2007  ? Endometriosis   ? FHx: cancer   ? FHx: diabetes mellitus   ? FHx: heart disease   ? FHx: kidney disease   ? FHx: migraine headaches   ? FHx: stroke   ? FHx: thyroid disease   ? H/O acute respiratory distress syndrome 2007  ? With a history of septic shock due to bladder infection  ? H/O dysmenorrhea 2005  ? H/O pyelonephritis   ? H/O septic shock 05/2006  ? H/O varicella   ? History of kidney stones   ? Hoarseness of voice   ? intermittent  ? Hx: UTI (urinary tract infection)   ? x 1  ? Low BMI 2007  ? During pregnancy  ? Postpartum care following vaginal delivery (3/28) 12/24/2013  ? Yeast infection   ? ? ?Past  Surgical History:  ?Procedure Laterality Date  ? BALLOON DILATION Left 08/18/2019  ? Procedure: LEFT URETERAL  DILATION;  Surgeon: Ardis Hughs, MD;  Location: [facility];  Service: Urology;  Laterality: Left;  ? CYSTOSCOPY WITH RETROGRADE PYELOGRAM, URETEROSCOPY AND STENT PLACEMENT Bilateral 08/18/2019  ? Procedure: CYSTOSCOPY WITH BILATERAL  RETROGRADE PYELOGRAM, LEFT DIAGNOSTICURETEROSCOPY AND LEFT  STENT PLACEMENT;  Surgeon: Ardis Hughs, MD;  Location: [facility];  Service: Urology;  Laterality: Bilateral;  ? PERCUTANEOUS NEPHROSTOMY    ? 2007 due to pyelonephritis  ? RADICAL HYSTERECTOMY  04/28/2019  ? cervix removed has ovaries, removed 1 fallopian tube  ? WISDOM TOOTH EXTRACTION    ? x 4  ? ? ?Social History  ? ?Tobacco Use  ? Smoking status: Never  ? Smokeless tobacco: Never  ?Vaping Use  ? Vaping Use: Never used  ?Substance Use Topics  ? Alcohol use: Yes  ?  Comment: occ  ? Drug use: No  ? ? ?Family History  ?Problem Relation Age of Onset  ? Diabetes Maternal Grandmother   ? Heart disease Maternal Grandmother   ?     heart attack  ? COPD Maternal Grandmother   ?     bronchitis  ? Hypertension Father   ?  Hyperlipidemia Father   ? Diabetes Father   ? Fibroids Mother   ?     had hyst  ? Hypertension Mother   ? Hyperlipidemia Mother   ? Diabetes Mother   ? Fibroids Paternal Aunt   ? Mental retardation Paternal Aunt   ? Cancer Paternal Grandfather   ?     brain tumor  ? Hypertension Maternal Grandfather   ? Stroke Maternal Grandfather   ? Kidney disease Maternal Grandfather   ?     dialysis  ? ? ?Allergies  ?Allergen Reactions  ? Amoxicillin Hives  ?  Did it involve swelling of the face/tongue/throat, SOB, or low BP? No ?Did it involve sudden or severe rash/hives, skin peeling, or any reaction on the inside of your mouth or nose? No ?Did you need to seek medical attention at a hospital or doctor's office? No ?When did it last happen?      as a child ?If all above  answers are ?NO?, may proceed with cephalosporin use. ?  ? Lactose Intolerance (Gi)   ? ? ?Medication list has been reviewed and updated. ? ?Current Outpatient Medications on File Prior to Visit  ?Medication Sig Dispense Refill  ? levocetirizine (XYZAL) 5 MG tablet Take 5 mg by mouth at bedtime.     ? montelukast (SINGULAIR) 10 MG tablet Take 10 mg by mouth at bedtime.     ? norethindrone (AYGESTIN) 5 MG tablet Take 5 mg by mouth daily.    ? ?No current facility-administered medications on file prior to visit.  ? ? ?Review of Systems: ? ?As per HPI- otherwise negative. ? ? ?Physical Examination: ?There were no vitals filed for this visit. ?There were no vitals filed for this visit. ?There is no height or weight on file to calculate BMI. ?Ideal Body Weight:   ? ?GEN: no acute distress. ?HEENT: Atraumatic, Normocephalic.  ?Ears and Nose: No external deformity. ?CV: RRR, No M/G/R. No JVD. No thrill. No extra heart sounds. ?PULM: CTA B, no wheezes, crackles, rhonchi. No retractions. No resp. distress. No accessory muscle use. ?ABD: S, NT, ND, +BS. No rebound. No HSM. ?EXTR: No c/c/e ?PSYCH: Normally interactive. Conversant.  ? ? ?Assessment and Plan: ?*** ? ?Signed ?Lamar Blinks, MD ? ?

## 2021-11-23 NOTE — Patient Instructions (Incomplete)
It was good to see you again today, I will be in touch with your lab results 

## 2021-11-26 ENCOUNTER — Encounter: Payer: BC Managed Care – PPO | Admitting: Family Medicine

## 2021-11-26 DIAGNOSIS — Z131 Encounter for screening for diabetes mellitus: Secondary | ICD-10-CM

## 2021-11-26 DIAGNOSIS — Z1329 Encounter for screening for other suspected endocrine disorder: Secondary | ICD-10-CM

## 2021-11-26 DIAGNOSIS — Z0000 Encounter for general adult medical examination without abnormal findings: Secondary | ICD-10-CM

## 2021-11-26 DIAGNOSIS — R5383 Other fatigue: Secondary | ICD-10-CM

## 2021-11-26 DIAGNOSIS — Z13220 Encounter for screening for lipoid disorders: Secondary | ICD-10-CM

## 2021-11-26 DIAGNOSIS — Z130 Encounter for screening for diseases of the blood and blood-forming organs and certain disorders involving the immune mechanism: Secondary | ICD-10-CM

## 2021-12-20 NOTE — Progress Notes (Addendum)
Therapist, music at [facility] ?[address], [address] ?[place],  [address] ?336 [phone] ?Fax [phone] ? ?Date:  12/24/2021  ? ?Name:  [name]   DOB:  October 25, 1985   MRN:  [id number] ? ?PCP:  Darreld Mclean, MD  ? ? ?Chief Complaint: Annual Exam (Concerns/ questions: pt thinks she is having an eczema flare on back, thighs and foot. Corinna Gab: 1/23: [facility]) ? ? ?History of Present Illness: ? ?[name] is a 36 y.o. very pleasant female patient who presents with the following: ? ?Patient seen today for physical exam ?Most recent visit with myself about 1 year ago-  history of allergies, DVT 2007 due to immobilization and endometriosis ?Last seen by myself about a year ago ? ?She had ureteral obstruction due to endometriosis in 2020, now recovered ?Her ureteral stents are now removed and she is doing well ?Obstruction was due to endometrioses  -not stones ?No more endometriosis sx  ?  ?Married, her children are 8 and 30- they are doing well.  Her oldest is running track, B ball.  The youngest is playing softball and tennis  ?  ?Her GYN is Dr Garwin Brothers. She is s/p hysterectomy for endometriosis  ? ?COVID booster-  ?Pap smear- per her GYN, she does continue pap screening  ?Lab work done 1 year ago, update today- she is not fasting  ? ?Wt Readings from Last 3 Encounters:  ?12/24/21 134 lb 4.8 oz (60.9 kg)  ?12/03/20 131 lb (59.4 kg)  ?11/25/20 136 lb (61.7 kg)  ?She is having some issues with eczema she thinks- she has noted for about a month, can be itchy ?She tried some OTC cortisone ?She notes she had eczema when pregnant in the past ? ?She tries to exercise by walking ?She works for [employer]  ? ?Patient Active Problem List  ? Diagnosis Date Noted  ? Endometriosis 01/09/2019  ? Hydroureter 01/09/2019  ? Bilateral hydronephrosis   ? Dysmenorrhea 02/13/2012  ? ALLERGIC RHINITIS 09/03/2009  ? Asthma 09/03/2009  ? DEEP VENOUS THROMBOPHLEBITIS, LEG, RIGHT 07/27/2007  ? ADULT RESPIRATORY  DISTRESS SYNDROME 07/27/2007  ? ? ?Past Medical History:  ?Diagnosis Date  ? Asthma   ? no inhaler used  ? Bilateral leg numbness 05/24/2007  ? Bronchitis, asthmatic   ? DVT (deep venous thrombosis), right 2007  ? Endometriosis   ? FHx: cancer   ? FHx: diabetes mellitus   ? FHx: heart disease   ? FHx: kidney disease   ? FHx: migraine headaches   ? FHx: stroke   ? FHx: thyroid disease   ? H/O acute respiratory distress syndrome 2007  ? With a history of septic shock due to bladder infection  ? H/O dysmenorrhea 2005  ? H/O pyelonephritis   ? H/O septic shock 05/2006  ? H/O varicella   ? History of kidney stones   ? Hoarseness of voice   ? intermittent  ? Hx: UTI (urinary tract infection)   ? x 1  ? Low BMI 2007  ? During pregnancy  ? Postpartum care following vaginal delivery (3/28) 12/24/2013  ? Yeast infection   ? ? ?Past Surgical History:  ?Procedure Laterality Date  ? BALLOON DILATION Left 08/18/2019  ? Procedure: LEFT URETERAL  DILATION;  Surgeon: Ardis Hughs, MD;  [facility];  Service: Urology;  Laterality: Left;  ? CYSTOSCOPY WITH RETROGRADE PYELOGRAM, URETEROSCOPY AND STENT PLACEMENT Bilateral 08/18/2019  ? Procedure: CYSTOSCOPY WITH BILATERAL  RETROGRADE PYELOGRAM, LEFT DIAGNOSTICURETEROSCOPY AND LEFT  STENT PLACEMENT;  Surgeon: Ardis Hughs, MD;  Location[facility];  Service: Urology;  Laterality: Bilateral;  ? PERCUTANEOUS NEPHROSTOMY    ? 2007 due to pyelonephritis  ? RADICAL HYSTERECTOMY  04/28/2019  ? cervix removed has ovaries, removed 1 fallopian tube  ? WISDOM TOOTH EXTRACTION    ? x 4  ? ? ?Social History  ? ?Tobacco Use  ? Smoking status: Never  ? Smokeless tobacco: Never  ?Vaping Use  ? Vaping Use: Never used  ?Substance Use Topics  ? Alcohol use: Yes  ?  Comment: occ  ? Drug use: No  ? ? ?Family History  ?Problem Relation Age of Onset  ? Diabetes Maternal Grandmother   ? Heart disease Maternal Grandmother   ?     heart attack  ? COPD Maternal  Grandmother   ?     bronchitis  ? Hypertension Father   ? Hyperlipidemia Father   ? Diabetes Father   ? Fibroids Mother   ?     had hyst  ? Hypertension Mother   ? Hyperlipidemia Mother   ? Diabetes Mother   ? Fibroids Paternal Aunt   ? Mental retardation Paternal Aunt   ? Cancer Paternal Grandfather   ?     brain tumor  ? Hypertension Maternal Grandfather   ? Stroke Maternal Grandfather   ? Kidney disease Maternal Grandfather   ?     dialysis  ? ? ?Allergies  ?Allergen Reactions  ? Amoxicillin Hives  ?  Did it involve swelling of the face/tongue/throat, SOB, or low BP? No ?Did it involve sudden or severe rash/hives, skin peeling, or any reaction on the inside of your mouth or nose? No ?Did you need to seek medical attention at a hospital or doctor's office? No ?When did it last happen?      as a child ?If all above answers are ?NO?, may proceed with cephalosporin use. ?  ? Lactose Intolerance (Gi)   ? ? ?Medication list has been reviewed and updated. ? ?Current Outpatient Medications on File Prior to Visit  ?Medication Sig Dispense Refill  ? levocetirizine (XYZAL) 5 MG tablet Take 5 mg by mouth at bedtime.     ? montelukast (SINGULAIR) 10 MG tablet Take 10 mg by mouth at bedtime.     ? norethindrone (AYGESTIN) 5 MG tablet Take 5 mg by mouth daily.    ? ?No current facility-administered medications on file prior to visit.  ? ? ?Review of Systems: ? ?As per HPI- otherwise negative. ? ? ?Physical Examination: ?Vitals:  ? 12/24/21 0913  ?BP: 110/76  ?Pulse: 90  ?Resp: 18  ?Temp: 98.9 ?F (37.2 ?C)  ?SpO2: 99%  ? ?Vitals:  ? 12/24/21 0913  ?Weight: 134 lb 4.8 oz (60.9 kg)  ?Height: '5\' 5"'$  (1.651 m)  ? ?Body mass index is 22.35 kg/m?. ?Ideal Body Weight: Weight in (lb) to have BMI = 25: 149.9 ? ?GEN: no acute distress. Normal weight, looks well  ?HEENT: Atraumatic, Normocephalic.  Bilateral TM wnl, oropharynx normal.  PEERL,EOMI. thyroid may be moderately enlarged ?Ears and Nose: No external deformity. ?CV: RRR, No M/G/R.  No JVD. No thrill. No extra heart sounds. ?PULM: CTA B, no wheezes, crackles, rhonchi. No retractions. No resp. distress. No accessory muscle use. ?ABD: S, NT, ND, +BS. No rebound. No HSM. ?EXTR: No c/c/e ?PSYCH: Normally interactive. Conversant.  ?I am not able to appreciate any particular eczema rash at  this time ? ?Assessment and Plan: ?Physical exam ? ?Screening for diabetes mellitus - Plan: Comprehensive metabolic panel, Hemoglobin A1c ? ?Screening for deficiency anemia - Plan: CBC ? ?Screening for thyroid disorder - Plan: TSH ? ?Screening for hyperlipidemia - Plan: Lipid panel ? ?Eczema, unspecified type - Plan: triamcinolone cream (KENALOG) 0.1 % ? ?Physical exam.  Encouraged healthy diet and exercise routine ?Will plan further follow- up pending labs. ?Okay to use triamcinolone cream as needed for eczema symptoms.  Also encouraged good moisturizing habits.  Hopefully skin will feel better as the weather gets warmer.  I have asked her to please keep me posted ?Also with labs will ask patient if okay to order thyroid ultrasound ? ?Signed ?Lamar Blinks, MD ? ? ?Received labs as below, message to pt ? ?Results for orders placed or performed in visit on 12/24/21  ?CBC  ?Result Value Ref Range  ? WBC 6.6 4.0 - 10.5 K/uL  ? RBC 4.41 3.87 - 5.11 Mil/uL  ? Platelets 298.0 150.0 - 400.0 K/uL  ? Hemoglobin 12.8 12.0 - 15.0 g/dL  ? HCT 38.3 36.0 - 46.0 %  ? MCV 86.9 78.0 - 100.0 fl  ? MCHC 33.4 30.0 - 36.0 g/dL  ? RDW 13.4 11.5 - 15.5 %  ?Comprehensive metabolic panel  ?Result Value Ref Range  ? Sodium 138 135 - 145 mEq/L  ? Potassium 4.0 3.5 - 5.1 mEq/L  ? Chloride 103 96 - 112 mEq/L  ? CO2 25 19 - 32 mEq/L  ? Glucose, Bld 100 (H) 70 - 99 mg/dL  ? BUN 10 6 - 23 mg/dL  ? Creatinine, Ser 0.66 0.40 - 1.20 mg/dL  ? Total Bilirubin 0.5 0.2 - 1.2 mg/dL  ? Alkaline Phosphatase 72 39 - 117 U/L  ? AST 24 0 - 37 U/L  ? ALT 28 0 - 35 U/L  ? Total Protein 7.3 6.0 - 8.3 g/dL  ? Albumin 4.7 3.5 - 5.2 g/dL  ? GFR 111.67 >60.00  mL/min  ? Calcium 9.5 8.4 - 10.5 mg/dL  ?Hemoglobin A1c  ?Result Value Ref Range  ? Hgb A1c MFr Bld 5.9 4.6 - 6.5 %  ?Lipid panel  ?Result Value Ref Range  ? Cholesterol 141 0 - 200 mg/dL  ? Triglyceride

## 2021-12-20 NOTE — Patient Instructions (Addendum)
It was great to see you again today!  I will be in touch with your labs asap ?Try the triamcinolone cream for eczema as needed- do not use on your face.  Also use a good moisturizer such as cetaphil or aquaphor on dry areas as needed ? ?Let me know if your eczema does not calm down again soon  ?

## 2021-12-24 ENCOUNTER — Ambulatory Visit (INDEPENDENT_AMBULATORY_CARE_PROVIDER_SITE_OTHER): Payer: BC Managed Care – PPO | Admitting: Family Medicine

## 2021-12-24 ENCOUNTER — Encounter: Payer: Self-pay | Admitting: Family Medicine

## 2021-12-24 VITALS — BP 110/76 | HR 90 | Temp 98.9°F | Resp 18 | Ht 65.0 in | Wt 134.3 lb

## 2021-12-24 DIAGNOSIS — Z131 Encounter for screening for diabetes mellitus: Secondary | ICD-10-CM

## 2021-12-24 DIAGNOSIS — Z13220 Encounter for screening for lipoid disorders: Secondary | ICD-10-CM

## 2021-12-24 DIAGNOSIS — Z1329 Encounter for screening for other suspected endocrine disorder: Secondary | ICD-10-CM | POA: Diagnosis not present

## 2021-12-24 DIAGNOSIS — L309 Dermatitis, unspecified: Secondary | ICD-10-CM

## 2021-12-24 DIAGNOSIS — Z130 Encounter for screening for diseases of the blood and blood-forming organs and certain disorders involving the immune mechanism: Secondary | ICD-10-CM | POA: Diagnosis not present

## 2021-12-24 DIAGNOSIS — Z0000 Encounter for general adult medical examination without abnormal findings: Secondary | ICD-10-CM | POA: Diagnosis not present

## 2021-12-24 DIAGNOSIS — E049 Nontoxic goiter, unspecified: Secondary | ICD-10-CM

## 2021-12-24 DIAGNOSIS — R7303 Prediabetes: Secondary | ICD-10-CM | POA: Insufficient documentation

## 2021-12-24 LAB — CBC
HCT: 38.3 % (ref 36.0–46.0)
Hemoglobin: 12.8 g/dL (ref 12.0–15.0)
MCHC: 33.4 g/dL (ref 30.0–36.0)
MCV: 86.9 fl (ref 78.0–100.0)
Platelets: 298 10*3/uL (ref 150.0–400.0)
RBC: 4.41 Mil/uL (ref 3.87–5.11)
RDW: 13.4 % (ref 11.5–15.5)
WBC: 6.6 10*3/uL (ref 4.0–10.5)

## 2021-12-24 LAB — COMPREHENSIVE METABOLIC PANEL
ALT: 28 U/L (ref 0–35)
AST: 24 U/L (ref 0–37)
Albumin: 4.7 g/dL (ref 3.5–5.2)
Alkaline Phosphatase: 72 U/L (ref 39–117)
BUN: 10 mg/dL (ref 6–23)
CO2: 25 mEq/L (ref 19–32)
Calcium: 9.5 mg/dL (ref 8.4–10.5)
Chloride: 103 mEq/L (ref 96–112)
Creatinine, Ser: 0.66 mg/dL (ref 0.40–1.20)
GFR: 111.67 mL/min (ref >60.00)
Glucose, Bld: 100 mg/dL — ABNORMAL HIGH (ref 70–99)
Potassium: 4 mEq/L (ref 3.5–5.1)
Sodium: 138 mEq/L (ref 135–145)
Total Bilirubin: 0.5 mg/dL (ref 0.2–1.2)
Total Protein: 7.3 g/dL (ref 6.0–8.3)

## 2021-12-24 LAB — LIPID PANEL
Cholesterol: 141 mg/dL (ref 0–200)
HDL: 32.1 mg/dL — ABNORMAL LOW (ref >39.00)
LDL Cholesterol: 100 mg/dL — ABNORMAL HIGH (ref 0–99)
NonHDL: 109.15
Total CHOL/HDL Ratio: 4
Triglycerides: 44 mg/dL (ref 0.0–149.0)
VLDL: 8.8 mg/dL (ref 0.0–40.0)

## 2021-12-24 LAB — TSH: TSH: 0.84 u[IU]/mL (ref 0.35–5.50)

## 2021-12-24 LAB — HEMOGLOBIN A1C: Hgb A1c MFr Bld: 5.9 % (ref 4.6–6.5)

## 2021-12-24 MED ORDER — TRIAMCINOLONE ACETONIDE 0.1 % EX CREA: 1.0000 "application " | TOPICAL_CREAM | Freq: Two times a day (BID) | CUTANEOUS | 1 refills | Status: AC

## 2022-01-09 ENCOUNTER — Ambulatory Visit (HOSPITAL_BASED_OUTPATIENT_CLINIC_OR_DEPARTMENT_OTHER)
Admission: RE | Admit: 2022-01-09 | Discharge: 2022-01-09 | Disposition: A | Discharge status: Home / Self Care | Payer: BC Managed Care – PPO | Source: Ambulatory Visit | Attending: Family Medicine | Admitting: Family Medicine

## 2022-01-09 ENCOUNTER — Encounter: Payer: Self-pay | Admitting: Family Medicine

## 2022-01-09 DIAGNOSIS — E049 Nontoxic goiter, unspecified: Secondary | ICD-10-CM

## 2022-01-13 ENCOUNTER — Encounter: Payer: Self-pay | Admitting: Family

## 2022-01-13 ENCOUNTER — Ambulatory Visit (INDEPENDENT_AMBULATORY_CARE_PROVIDER_SITE_OTHER): Payer: BC Managed Care – PPO | Admitting: Family

## 2022-01-13 ENCOUNTER — Ambulatory Visit (INDEPENDENT_AMBULATORY_CARE_PROVIDER_SITE_OTHER)
Admission: RE | Admit: 2022-01-13 | Discharge: 2022-01-13 | Disposition: A | Discharge status: Home / Self Care | Payer: BC Managed Care – PPO | Source: Ambulatory Visit | Attending: Family | Admitting: Family

## 2022-01-13 VITALS — BP 120/70 | HR 106 | Temp 98.2°F | Ht 65.0 in | Wt 146.2 lb

## 2022-01-13 DIAGNOSIS — M7989 Other specified soft tissue disorders: Secondary | ICD-10-CM | POA: Diagnosis not present

## 2022-01-13 DIAGNOSIS — M79641 Pain in right hand: Secondary | ICD-10-CM | POA: Diagnosis not present

## 2022-01-13 DIAGNOSIS — S62646A Nondisplaced fracture of proximal phalanx of right little finger, initial encounter for closed fracture: Secondary | ICD-10-CM | POA: Diagnosis not present

## 2022-01-13 NOTE — Progress Notes (Signed)
?[name] is a 36 y.o. female with the following history as recorded in EpicCare:  ?Patient Active Problem List  ? Diagnosis Date Noted  ? Pre-diabetes 12/24/2021  ? Endometriosis 01/09/2019  ? Hydroureter 01/09/2019  ? Bilateral hydronephrosis   ? Dysmenorrhea 02/13/2012  ? ALLERGIC RHINITIS 09/03/2009  ? Asthma 09/03/2009  ? DEEP VENOUS THROMBOPHLEBITIS, LEG, RIGHT 07/27/2007  ? ADULT RESPIRATORY DISTRESS SYNDROME 07/27/2007  ?  ?Current Outpatient Medications  ?Medication Sig Dispense Refill  ? levocetirizine (XYZAL) 5 MG tablet Take 5 mg by mouth at bedtime.     ? montelukast (SINGULAIR) 10 MG tablet Take 10 mg by mouth at bedtime.     ? norethindrone (AYGESTIN) 5 MG tablet Take 5 mg by mouth daily.    ? triamcinolone cream (KENALOG) 0.1 % Apply 1 application. topically 2 (two) times daily. 90 g 1  ? ?No current facility-administered medications for this visit.  ?  ?Allergies: Amoxicillin and Lactose intolerance (gi)  ?Past Medical History:  ?Diagnosis Date  ? Asthma   ? no inhaler used  ? Bilateral leg numbness 05/24/2007  ? Bronchitis, asthmatic   ? DVT (deep venous thrombosis), right 2007  ? Endometriosis   ? FHx: cancer   ? FHx: diabetes mellitus   ? FHx: heart disease   ? FHx: kidney disease   ? FHx: migraine headaches   ? FHx: stroke   ? FHx: thyroid disease   ? H/O acute respiratory distress syndrome 2007  ? With a history of septic shock due to bladder infection  ? H/O dysmenorrhea 2005  ? H/O pyelonephritis   ? H/O septic shock 05/2006  ? H/O varicella   ? History of kidney stones   ? Hoarseness of voice   ? intermittent  ? Hx: UTI (urinary tract infection)   ? x 1  ? Low BMI 2007  ? During pregnancy  ? Postpartum care following vaginal delivery (3/28) 12/24/2013  ? Yeast infection   ?  ?Past Surgical History:  ?Procedure Laterality Date  ? BALLOON DILATION Left 08/18/2019  ? Procedure: LEFT URETERAL  DILATION;  Surgeon: Ardis Hughs, MD;  Location: [facility];  Service:  Urology;  Laterality: Left;  ? CYSTOSCOPY WITH RETROGRADE PYELOGRAM, URETEROSCOPY AND STENT PLACEMENT Bilateral 08/18/2019  ? Procedure: CYSTOSCOPY WITH BILATERAL  RETROGRADE PYELOGRAM, LEFT DIAGNOSTICURETEROSCOPY AND LEFT  STENT PLACEMENT;  Surgeon: Ardis Hughs, MD;  Location: [facility];  Service: Urology;  Laterality: Bilateral;  ? PERCUTANEOUS NEPHROSTOMY    ? 2007 due to pyelonephritis  ? RADICAL HYSTERECTOMY  04/28/2019  ? cervix removed has ovaries, removed 1 fallopian tube  ? WISDOM TOOTH EXTRACTION    ? x 4  ?  ?Family History  ?Problem Relation Age of Onset  ? Diabetes Maternal Grandmother   ? Heart disease Maternal Grandmother   ?     heart attack  ? COPD Maternal Grandmother   ?     bronchitis  ? Hypertension Father   ? Hyperlipidemia Father   ? Diabetes Father   ? Fibroids Mother   ?     had hyst  ? Hypertension Mother   ? Hyperlipidemia Mother   ? Diabetes Mother   ? Fibroids Paternal Aunt   ? Mental retardation Paternal Aunt   ? Cancer Paternal Grandfather   ?     brain tumor  ? Hypertension Maternal Grandfather   ? Stroke Maternal Grandfather   ? Kidney disease Maternal Grandfather   ?  dialysis  ?  ?Social History  ? ?Tobacco Use  ? Smoking status: Never  ? Smokeless tobacco: Never  ?Substance Use Topics  ? Alcohol use: Yes  ?  Comment: occ  ?  ?Subjective:  ? ?Patient presents with concerns for right hand swelling x 3 days; notes that she tripped and fell on her right hand while walking her dog; is having some increased swelling/ bruising of right hand; is right hand dominant;  ? ? ? ?Objective:  ?Vitals:  ? 01/13/22 1548  ?BP: 120/70  ?Pulse: (!) 106  ?Temp: 98.2 ?F (36.8 ?C)  ?TempSrc: Oral  ?SpO2: 98%  ?Weight: 146 lb 3.2 oz (66.3 kg)  ?Height: '5\' 5"'$  (1.651 m)  ?  ?General: Well developed, well nourished, in no acute distress  ?Skin : Warm and dry.  ?Head: Normocephalic and atraumatic  ?Lungs: Respirations unlabored;  ?Musculoskeletal: No deformities; swelling/  bruising noted along lateral side of right hand;  ?Extremities: No edema, cyanosis, clubbing  ?Vessels: Symmetric bilaterally  ?Neurologic: Alert and oriented; speech intact; face symmetrical; moves all extremities well; CNII-XII intact without focal deficit  ? ?Assessment:  ?1. Right hand pain   ?  ?Plan:  ?Concern for possible fracture; STAT X-ray updated today; follow up to be determined;  ? ?This visit occurred during the SARS-CoV-2 public health emergency.  Safety protocols were in place, including screening questions prior to the visit, additional usage of staff PPE, and extensive cleaning of exam room while observing appropriate contact time as indicated for disinfecting solutions.  ? ? ?No follow-ups on file.  ?Orders Placed This Encounter  ?Procedures  ? DG Hand Complete Right  ?  Standing Status:   Future  ?  Standing Expiration Date:   01/14/2023  ?  Order Specific Question:   Reason for Exam (SYMPTOM  OR DIAGNOSIS REQUIRED)  ?  Answer:   right hand pain/ swelling  ?  Order Specific Question:   Is patient pregnant?  ?  Answer:   No  ?  Order Specific Question:   Preferred imaging location?  ?  Answer:   [facility]  ?  ?Requested Prescriptions  ? ? No prescriptions requested or ordered in this encounter  ?  ? ?

## 2022-01-13 NOTE — Progress Notes (Signed)
Reviewed with patient; she will go to orthopedic urgent care tonight;

## 2022-01-15 DIAGNOSIS — S62616A Displaced fracture of proximal phalanx of right little finger, initial encounter for closed fracture: Secondary | ICD-10-CM | POA: Diagnosis not present

## 2022-01-19 DIAGNOSIS — G8918 Other acute postprocedural pain: Secondary | ICD-10-CM | POA: Diagnosis not present

## 2022-01-19 DIAGNOSIS — S62616A Displaced fracture of proximal phalanx of right little finger, initial encounter for closed fracture: Secondary | ICD-10-CM | POA: Diagnosis not present

## 2022-01-19 DIAGNOSIS — Y999 Unspecified external cause status: Secondary | ICD-10-CM | POA: Diagnosis not present

## 2022-01-19 DIAGNOSIS — X58XXXA Exposure to other specified factors, initial encounter: Secondary | ICD-10-CM | POA: Diagnosis not present

## 2022-01-22 DIAGNOSIS — S62616A Displaced fracture of proximal phalanx of right little finger, initial encounter for closed fracture: Secondary | ICD-10-CM | POA: Diagnosis not present

## 2022-01-22 DIAGNOSIS — R52 Pain, unspecified: Secondary | ICD-10-CM | POA: Diagnosis not present

## 2022-02-05 DIAGNOSIS — S62616A Displaced fracture of proximal phalanx of right little finger, initial encounter for closed fracture: Secondary | ICD-10-CM | POA: Diagnosis not present

## 2022-02-19 DIAGNOSIS — S62616A Displaced fracture of proximal phalanx of right little finger, initial encounter for closed fracture: Secondary | ICD-10-CM | POA: Diagnosis not present

## 2022-03-04 DIAGNOSIS — M40292 Other kyphosis, cervical region: Secondary | ICD-10-CM | POA: Diagnosis not present

## 2022-03-04 DIAGNOSIS — R293 Abnormal posture: Secondary | ICD-10-CM | POA: Diagnosis not present

## 2022-03-04 DIAGNOSIS — M9902 Segmental and somatic dysfunction of thoracic region: Secondary | ICD-10-CM | POA: Diagnosis not present

## 2022-03-04 DIAGNOSIS — M62830 Muscle spasm of back: Secondary | ICD-10-CM | POA: Diagnosis not present

## 2022-03-04 DIAGNOSIS — M9901 Segmental and somatic dysfunction of cervical region: Secondary | ICD-10-CM | POA: Diagnosis not present

## 2022-03-04 DIAGNOSIS — M9905 Segmental and somatic dysfunction of pelvic region: Secondary | ICD-10-CM | POA: Diagnosis not present

## 2022-03-05 DIAGNOSIS — S62616A Displaced fracture of proximal phalanx of right little finger, initial encounter for closed fracture: Secondary | ICD-10-CM | POA: Diagnosis not present

## 2022-03-17 DIAGNOSIS — M9901 Segmental and somatic dysfunction of cervical region: Secondary | ICD-10-CM | POA: Diagnosis not present

## 2022-03-17 DIAGNOSIS — M9903 Segmental and somatic dysfunction of lumbar region: Secondary | ICD-10-CM | POA: Diagnosis not present

## 2022-03-23 DIAGNOSIS — M9903 Segmental and somatic dysfunction of lumbar region: Secondary | ICD-10-CM | POA: Diagnosis not present

## 2022-03-23 DIAGNOSIS — M9901 Segmental and somatic dysfunction of cervical region: Secondary | ICD-10-CM | POA: Diagnosis not present

## 2022-03-26 DIAGNOSIS — M9901 Segmental and somatic dysfunction of cervical region: Secondary | ICD-10-CM | POA: Diagnosis not present

## 2022-03-26 DIAGNOSIS — M9903 Segmental and somatic dysfunction of lumbar region: Secondary | ICD-10-CM | POA: Diagnosis not present

## 2022-03-27 DIAGNOSIS — M9903 Segmental and somatic dysfunction of lumbar region: Secondary | ICD-10-CM | POA: Diagnosis not present

## 2022-03-27 DIAGNOSIS — M9901 Segmental and somatic dysfunction of cervical region: Secondary | ICD-10-CM | POA: Diagnosis not present

## 2022-04-02 DIAGNOSIS — M9903 Segmental and somatic dysfunction of lumbar region: Secondary | ICD-10-CM | POA: Diagnosis not present

## 2022-04-02 DIAGNOSIS — M9901 Segmental and somatic dysfunction of cervical region: Secondary | ICD-10-CM | POA: Diagnosis not present

## 2022-04-06 DIAGNOSIS — M9903 Segmental and somatic dysfunction of lumbar region: Secondary | ICD-10-CM | POA: Diagnosis not present

## 2022-04-06 DIAGNOSIS — M9901 Segmental and somatic dysfunction of cervical region: Secondary | ICD-10-CM | POA: Diagnosis not present

## 2022-04-09 DIAGNOSIS — M9903 Segmental and somatic dysfunction of lumbar region: Secondary | ICD-10-CM | POA: Diagnosis not present

## 2022-04-09 DIAGNOSIS — M9901 Segmental and somatic dysfunction of cervical region: Secondary | ICD-10-CM | POA: Diagnosis not present

## 2022-04-13 DIAGNOSIS — M9901 Segmental and somatic dysfunction of cervical region: Secondary | ICD-10-CM | POA: Diagnosis not present

## 2022-04-13 DIAGNOSIS — M9903 Segmental and somatic dysfunction of lumbar region: Secondary | ICD-10-CM | POA: Diagnosis not present

## 2022-04-16 DIAGNOSIS — S62616A Displaced fracture of proximal phalanx of right little finger, initial encounter for closed fracture: Secondary | ICD-10-CM | POA: Diagnosis not present

## 2022-04-17 DIAGNOSIS — M40292 Other kyphosis, cervical region: Secondary | ICD-10-CM | POA: Diagnosis not present

## 2022-04-17 DIAGNOSIS — M9903 Segmental and somatic dysfunction of lumbar region: Secondary | ICD-10-CM | POA: Diagnosis not present

## 2022-04-17 DIAGNOSIS — M9901 Segmental and somatic dysfunction of cervical region: Secondary | ICD-10-CM | POA: Diagnosis not present

## 2022-04-17 DIAGNOSIS — M546 Pain in thoracic spine: Secondary | ICD-10-CM | POA: Diagnosis not present

## 2022-04-20 DIAGNOSIS — R29898 Other symptoms and signs involving the musculoskeletal system: Secondary | ICD-10-CM | POA: Diagnosis not present

## 2022-04-20 DIAGNOSIS — M25641 Stiffness of right hand, not elsewhere classified: Secondary | ICD-10-CM | POA: Diagnosis not present

## 2022-04-20 DIAGNOSIS — S62616A Displaced fracture of proximal phalanx of right little finger, initial encounter for closed fracture: Secondary | ICD-10-CM | POA: Diagnosis not present

## 2022-04-20 DIAGNOSIS — R52 Pain, unspecified: Secondary | ICD-10-CM | POA: Diagnosis not present

## 2022-04-24 DIAGNOSIS — M9903 Segmental and somatic dysfunction of lumbar region: Secondary | ICD-10-CM | POA: Diagnosis not present

## 2022-04-24 DIAGNOSIS — M9901 Segmental and somatic dysfunction of cervical region: Secondary | ICD-10-CM | POA: Diagnosis not present

## 2022-04-27 DIAGNOSIS — M9901 Segmental and somatic dysfunction of cervical region: Secondary | ICD-10-CM | POA: Diagnosis not present

## 2022-04-27 DIAGNOSIS — M9903 Segmental and somatic dysfunction of lumbar region: Secondary | ICD-10-CM | POA: Diagnosis not present

## 2022-04-29 DIAGNOSIS — M9901 Segmental and somatic dysfunction of cervical region: Secondary | ICD-10-CM | POA: Diagnosis not present

## 2022-04-29 DIAGNOSIS — M9903 Segmental and somatic dysfunction of lumbar region: Secondary | ICD-10-CM | POA: Diagnosis not present

## 2022-12-28 ENCOUNTER — Encounter: Payer: BC Managed Care – PPO | Admitting: Family Medicine

## 2023-01-19 NOTE — Progress Notes (Addendum)
[facility] [address], [address] [place], [place] [address] [phone]  Date:  01/25/2023   Name:  [name]   DOB:  02-03-1986   MRN:  [id number]  PCP:  Pearline Cables, MD    Chief Complaint: Annual Exam (Concerns/ questions: none/Pap: scheduled for May 8- Dr Cherly Hensen)   History of Present Illness:  [name] is a 37 y.o. very pleasant female patient who presents with the following:  Pt seen today for a CPE Last seen by myself about one year ago   History of allergies, DVT 2007 due to immobilization and endometriosis, prediabetes She had ureteral obstruction due to endometriosis in 2020, now recovered Her ureteral stents are now removed and she is doing well Obstruction was due to endometrioses  -not stones No more endometriosis sx - s/p hyst  GYN is Dr Cherly Hensen- for GYN care  She does have 2 aunts with history of breast cancer in their 38s.  Married, children are 40 and 81- 8th grader, he will be going to [employer]   She notes her allergies have not been too bad   She would like to get more exercise- no CP or SOB noted No family history of colon cancer  She works in [employer] with [employer] Never a smoker, rare etoh  Lab Results  Component Value Date   HGBA1C 5.9 12/24/2021      Patient Active Problem List   Diagnosis Date Noted   Pre-diabetes 12/24/2021   Endometriosis 01/09/2019   Hydroureter 01/09/2019   Bilateral hydronephrosis    Dysmenorrhea 02/13/2012   ALLERGIC RHINITIS 09/03/2009   Asthma 09/03/2009   DEEP VENOUS THROMBOPHLEBITIS, LEG, RIGHT 07/27/2007   ADULT RESPIRATORY DISTRESS SYNDROME 07/27/2007    Past Medical History:  Diagnosis Date   Asthma    no inhaler used   Bilateral leg numbness 05/24/2007   Bronchitis, asthmatic    DVT (deep venous thrombosis), right 2007   Endometriosis    FHx: cancer    FHx: diabetes mellitus    FHx: heart disease    FHx: kidney  disease    FHx: migraine headaches    FHx: stroke    FHx: thyroid disease    H/O acute respiratory distress syndrome 2007   With a history of septic shock due to bladder infection   H/O dysmenorrhea 2005   H/O pyelonephritis    H/O septic shock 05/2006   H/O varicella    History of kidney stones    Hoarseness of voice    intermittent   Hx: UTI (urinary tract infection)    x 1   Low BMI 2007   During pregnancy   Postpartum care following vaginal delivery (3/28) 12/24/2013   Yeast infection     Past Surgical History:  Procedure Laterality Date   BALLOON DILATION Left 08/18/2019   Procedure: LEFT URETERAL  DILATION;  Surgeon: Crist Fat, MD;  Location: [facility];  Service: Urology;  Laterality: Left;   CYSTOSCOPY WITH RETROGRADE PYELOGRAM, URETEROSCOPY AND STENT PLACEMENT Bilateral 08/18/2019   Procedure: CYSTOSCOPY WITH BILATERAL  RETROGRADE PYELOGRAM, LEFT DIAGNOSTICURETEROSCOPY AND LEFT  STENT PLACEMENT;  Surgeon: Crist Fat, MD;  Location: [facility];  Service: Urology;  Laterality: Bilateral;   PERCUTANEOUS NEPHROSTOMY     2007 due to pyelonephritis   RADICAL HYSTERECTOMY  04/28/2019   cervix removed has ovaries, removed 1 fallopian tube   WISDOM  TOOTH EXTRACTION     x 4    Social History   Tobacco Use   Smoking status: Never   Smokeless tobacco: Never  Vaping Use   Vaping Use: Never used  Substance Use Topics   Alcohol use: Yes    Comment: occ   Drug use: No    Family History  Problem Relation Age of Onset   Diabetes Maternal Grandmother    Heart disease Maternal Grandmother        heart attack   COPD Maternal Grandmother        bronchitis   Hypertension Father    Hyperlipidemia Father    Diabetes Father    Fibroids Mother        had hyst   Hypertension Mother    Hyperlipidemia Mother    Diabetes Mother    Fibroids Paternal Aunt    Mental retardation Paternal Aunt    Cancer Paternal Grandfather         brain tumor   Hypertension Maternal Grandfather    Stroke Maternal Grandfather    Kidney disease Maternal Grandfather        dialysis    Allergies  Allergen Reactions   Amoxicillin Hives    Did it involve swelling of the face/tongue/throat, SOB, or low BP? No Did it involve sudden or severe rash/hives, skin peeling, or any reaction on the inside of your mouth or nose? No Did you need to seek medical attention at a hospital or doctor's office? No When did it last happen?      as a child If all above answers are "NO", may proceed with cephalosporin use.    Lactose Intolerance (Gi)     Medication list has been reviewed and updated.  Current Outpatient Medications on File Prior to Visit  Medication Sig Dispense Refill   levocetirizine (XYZAL) 5 MG tablet Take 5 mg by mouth at bedtime.      montelukast (SINGULAIR) 10 MG tablet Take 10 mg by mouth at bedtime.      norethindrone (AYGESTIN) 5 MG tablet Take 5 mg by mouth daily.     triamcinolone cream (KENALOG) 0.1 % Apply 1 application. topically 2 (two) times daily. 90 g 1   No current facility-administered medications on file prior to visit.    Review of Systems:  As per HPI- otherwise negative.   Physical Examination: Vitals:   01/25/23 1125  BP: 110/60  Pulse: 93  Resp: 18  Temp: 98.5 F (36.9 C)  SpO2: 98%   Vitals:   01/25/23 1125  Weight: 152 lb 3.2 oz (69 kg)  Height: 5\' 5"  (1.651 m)   Body mass index is 25.33 kg/m. Ideal Body Weight: Weight in (lb) to have BMI = 25: 149.9  GEN: no acute distress.  Normal weight, looks well  HEENT: Atraumatic, Normocephalic.  Bilateral TM wnl, oropharynx normal.  PEERL,EOMI.   Ears and Nose: No external deformity. CV: RRR, No M/G/R. No JVD. No thrill. No extra heart sounds. PULM: CTA B, no wheezes, crackles, rhonchi. No retractions. No resp. distress. No accessory muscle use. ABD: S, NT, ND, +BS. No rebound. No HSM. EXTR: No c/c/e PSYCH: Normally interactive.  Conversant.    Assessment and Plan: Physical exam  Screening for deficiency anemia - Plan: CBC  Screening for thyroid disorder - Plan: TSH  Screening for hyperlipidemia - Plan: Lipid panel  Pre-diabetes - Plan: Comprehensive metabolic panel, Hemoglobin A1c  Fatigue, unspecified type - Plan: VITAMIN D 25 Hydroxy (Vit-D Deficiency, Fractures)  Seasonal allergic rhinitis due to other allergic trigger - Plan: montelukast (SINGULAIR) 10 MG tablet, fluticasone (FLONASE) 50 MCG/ACT nasal spray  Physical exam today- encouraged healthy diet and exercise routine Will plan further follow- up pending labs. Encouraged her to discuss family history of breast cancer with Dr. Cherly Hensen.  It is possible she might recommend that she start screening earlier than age 61 Patient has noticed some decrease in her sense of smell for the last 2 or 3 months.  She does not think she had COVID but it is possible.  She does have allergies, she is taking Singulair but ran out of her Flonase.  She notes that her taste sense seems to be normal.  I will have her start back on Flonase, asked her to let me know if this does not restore her sense of smell Signed Abbe Amsterdam, MD  Received labs as below, message to pt  Results for orders placed or performed in visit on 01/25/23  CBC  Result Value Ref Range   WBC 8.0 4.0 - 10.5 K/uL   RBC 4.46 3.87 - 5.11 Mil/uL   Platelets 332.0 150.0 - 400.0 K/uL   Hemoglobin 13.1 12.0 - 15.0 g/dL   HCT 16.1 09.6 - 04.5 %   MCV 86.8 78.0 - 100.0 fl   MCHC 33.8 30.0 - 36.0 g/dL   RDW 40.9 81.1 - 91.4 %  Comprehensive metabolic panel  Result Value Ref Range   Sodium 138 135 - 145 mEq/L   Potassium 4.2 3.5 - 5.1 mEq/L   Chloride 106 96 - 112 mEq/L   CO2 25 19 - 32 mEq/L   Glucose, Bld 74 70 - 99 mg/dL   BUN 8 6 - 23 mg/dL   Creatinine, Ser 7.82 0.40 - 1.20 mg/dL   Total Bilirubin 0.3 0.2 - 1.2 mg/dL   Alkaline Phosphatase 71 39 - 117 U/L   AST 12 0 - 37 U/L   ALT 11 0 -  35 U/L   Total Protein 7.3 6.0 - 8.3 g/dL   Albumin 4.4 3.5 - 5.2 g/dL   GFR 956.21 >30.86 mL/min   Calcium 9.5 8.4 - 10.5 mg/dL  Hemoglobin V7Q  Result Value Ref Range   Hgb A1c MFr Bld 5.9 4.6 - 6.5 %  Lipid panel  Result Value Ref Range   Cholesterol 151 0 - 200 mg/dL   Triglycerides 46.9 0.0 - 149.0 mg/dL   HDL 62.95 (L) >28.41 mg/dL   VLDL 32.4 0.0 - 40.1 mg/dL   LDL Cholesterol 027 (H) 0 - 99 mg/dL   Total CHOL/HDL Ratio 5    NonHDL 121.60   TSH  Result Value Ref Range   TSH 0.96 0.35 - 5.50 uIU/mL  VITAMIN D 25 Hydroxy (Vit-D Deficiency, Fractures)  Result Value Ref Range   VITD 26.40 (L) 30.00 - 100.00 ng/mL  HM PAP SMEAR  Result Value Ref Range   HM Pap smear normal   Results Console HPV  Result Value Ref Range   CHL HPV Negative

## 2023-01-25 ENCOUNTER — Ambulatory Visit (INDEPENDENT_AMBULATORY_CARE_PROVIDER_SITE_OTHER): Payer: BC Managed Care – PPO | Admitting: Family Medicine

## 2023-01-25 ENCOUNTER — Encounter: Payer: Self-pay | Admitting: Family Medicine

## 2023-01-25 VITALS — BP 110/60 | HR 93 | Temp 98.5°F | Resp 18 | Ht 65.0 in | Wt 152.2 lb

## 2023-01-25 DIAGNOSIS — R7303 Prediabetes: Secondary | ICD-10-CM

## 2023-01-25 DIAGNOSIS — Z0000 Encounter for general adult medical examination without abnormal findings: Secondary | ICD-10-CM

## 2023-01-25 DIAGNOSIS — R5383 Other fatigue: Secondary | ICD-10-CM | POA: Diagnosis not present

## 2023-01-25 DIAGNOSIS — Z130 Encounter for screening for diseases of the blood and blood-forming organs and certain disorders involving the immune mechanism: Secondary | ICD-10-CM

## 2023-01-25 DIAGNOSIS — Z1329 Encounter for screening for other suspected endocrine disorder: Secondary | ICD-10-CM | POA: Diagnosis not present

## 2023-01-25 DIAGNOSIS — J3089 Other allergic rhinitis: Secondary | ICD-10-CM

## 2023-01-25 DIAGNOSIS — Z13220 Encounter for screening for lipoid disorders: Secondary | ICD-10-CM

## 2023-01-25 LAB — VITAMIN D 25 HYDROXY (VIT D DEFICIENCY, FRACTURES): VITD: 26.4 ng/mL — ABNORMAL LOW (ref 30.00–100.00)

## 2023-01-25 LAB — COMPREHENSIVE METABOLIC PANEL
ALT: 11 U/L (ref 0–35)
AST: 12 U/L (ref 0–37)
Albumin: 4.4 g/dL (ref 3.5–5.2)
Alkaline Phosphatase: 71 U/L (ref 39–117)
BUN: 8 mg/dL (ref 6–23)
CO2: 25 mEq/L (ref 19–32)
Calcium: 9.5 mg/dL (ref 8.4–10.5)
Chloride: 106 mEq/L (ref 96–112)
Creatinine, Ser: 0.59 mg/dL (ref 0.40–1.20)
GFR: 113.86 mL/min (ref >60.00)
Glucose, Bld: 74 mg/dL (ref 70–99)
Potassium: 4.2 mEq/L (ref 3.5–5.1)
Sodium: 138 mEq/L (ref 135–145)
Total Bilirubin: 0.3 mg/dL (ref 0.2–1.2)
Total Protein: 7.3 g/dL (ref 6.0–8.3)

## 2023-01-25 LAB — LIPID PANEL
Cholesterol: 151 mg/dL (ref 0–200)
HDL: 29.2 mg/dL — ABNORMAL LOW (ref >39.00)
LDL Cholesterol: 106 mg/dL — ABNORMAL HIGH (ref 0–99)
NonHDL: 121.6
Total CHOL/HDL Ratio: 5
Triglycerides: 77 mg/dL (ref 0.0–149.0)
VLDL: 15.4 mg/dL (ref 0.0–40.0)

## 2023-01-25 LAB — CBC
HCT: 38.7 % (ref 36.0–46.0)
Hemoglobin: 13.1 g/dL (ref 12.0–15.0)
MCHC: 33.8 g/dL (ref 30.0–36.0)
MCV: 86.8 fl (ref 78.0–100.0)
Platelets: 332 10*3/uL (ref 150.0–400.0)
RBC: 4.46 Mil/uL (ref 3.87–5.11)
RDW: 13.7 % (ref 11.5–15.5)
WBC: 8 10*3/uL (ref 4.0–10.5)

## 2023-01-25 LAB — TSH: TSH: 0.96 u[IU]/mL (ref 0.35–5.50)

## 2023-01-25 LAB — HEMOGLOBIN A1C: Hgb A1c MFr Bld: 5.9 % (ref 4.6–6.5)

## 2023-01-25 MED ORDER — FLUTICASONE PROPIONATE 50 MCG/ACT NA SUSP
2.0000 | Freq: Every day | NASAL | 3 refills | Status: AC
Start: 2023-01-25 — End: ?

## 2023-01-25 MED ORDER — MONTELUKAST SODIUM 10 MG PO TABS: 10.0000 mg | ORAL_TABLET | Freq: Every day | ORAL | 3 refills | Status: DC

## 2023-01-25 NOTE — Patient Instructions (Signed)
Great to see you again today!   Have a wonderful spring season I will be in touch with your labs Try adding back flonase- let me know if this does not improve your sense of smell  Try to take time for exercise- this will pay off as you get older!

## 2023-02-03 DIAGNOSIS — Z01419 Encounter for gynecological examination (general) (routine) without abnormal findings: Secondary | ICD-10-CM | POA: Diagnosis not present

## 2023-02-05 DIAGNOSIS — J301 Allergic rhinitis due to pollen: Secondary | ICD-10-CM | POA: Diagnosis not present

## 2023-02-05 DIAGNOSIS — J3081 Allergic rhinitis due to animal (cat) (dog) hair and dander: Secondary | ICD-10-CM | POA: Diagnosis not present

## 2023-02-05 DIAGNOSIS — J4520 Mild intermittent asthma, uncomplicated: Secondary | ICD-10-CM | POA: Diagnosis not present

## 2023-02-05 DIAGNOSIS — H1045 Other chronic allergic conjunctivitis: Secondary | ICD-10-CM | POA: Diagnosis not present

## 2023-10-07 ENCOUNTER — Telehealth: Payer: Self-pay

## 2023-10-07 NOTE — Telephone Encounter (Signed)
 Copied from CRM [id number]. Topic: Clinical - Medical Advice >> Oct 07, 2023  8:12 AM Gurney Maxin H wrote: Reason for CRM: Patient is calling to see if she can have a prescription for Tamaflu, states one person in house has Covid and the other has the flu.

## 2023-10-07 NOTE — Telephone Encounter (Signed)
 Pt aware and voices understanding.

## 2023-10-26 ENCOUNTER — Ambulatory Visit: Payer: BC Managed Care – PPO | Admitting: Internal Medicine

## 2023-10-26 ENCOUNTER — Encounter: Payer: Self-pay | Admitting: Internal Medicine

## 2023-10-26 VITALS — BP 126/80 | HR 99 | Temp 98.2°F | Resp 16 | Ht 65.0 in | Wt 157.4 lb

## 2023-10-26 DIAGNOSIS — J069 Acute upper respiratory infection, unspecified: Secondary | ICD-10-CM | POA: Diagnosis not present

## 2023-10-26 LAB — POCT INFLUENZA A/B
Influenza A, POC: NEGATIVE
Influenza B, POC: NEGATIVE

## 2023-10-26 LAB — POC COVID19 BINAXNOW: SARS Coronavirus 2 Ag: NEGATIVE

## 2023-10-26 MED ORDER — AZITHROMYCIN 250 MG PO TABS: ORAL_TABLET | ORAL | 0 refills | Status: DC

## 2023-10-26 NOTE — Progress Notes (Unsigned)
Subjective:    Patient ID: [name], female    DOB: 04-20-1986, 38 y.o.   MRN: [id number]  DOS:  10/26/2023 Type of visit - description: Acute  This started a week ago: Mostly with cough, dry. This week, cough is getting slightly worse and seems "deeper". This morning had pressure at the right sinus area, that pain is better at the time of the visit.  Denies nasal discharge.  No recent fever or chills. No nausea vomiting. No myalgias. Has not checked for COVID at home. Taking Delsym prn.   Review of Systems See above   Past Medical History:  Diagnosis Date   Asthma    no inhaler used   Bilateral leg numbness 05/24/2007   Bronchitis, asthmatic    DVT (deep venous thrombosis), right 2007   Endometriosis    FHx: cancer    FHx: diabetes mellitus    FHx: heart disease    FHx: kidney disease    FHx: migraine headaches    FHx: stroke    FHx: thyroid disease    H/O acute respiratory distress syndrome 2007   With a history of septic shock due to bladder infection   H/O dysmenorrhea 2005   H/O pyelonephritis    H/O septic shock 05/2006   H/O varicella    History of kidney stones    Hoarseness of voice    intermittent   Hx: UTI (urinary tract infection)    x 1   Low BMI 2007   During pregnancy   Postpartum care following vaginal delivery (3/28) 12/24/2013   Yeast infection     Past Surgical History:  Procedure Laterality Date   BALLOON DILATION Left 08/18/2019   Procedure: LEFT URETERAL  DILATION;  Surgeon: Crist Fat, MD;  Location: [facility];  Service: Urology;  Laterality: Left;   CYSTOSCOPY WITH RETROGRADE PYELOGRAM, URETEROSCOPY AND STENT PLACEMENT Bilateral 08/18/2019   Procedure: CYSTOSCOPY WITH BILATERAL  RETROGRADE PYELOGRAM, LEFT DIAGNOSTICURETEROSCOPY AND LEFT  STENT PLACEMENT;  Surgeon: Crist Fat, MD;  Location: [facility];  Service: Urology;  Laterality: Bilateral;   PERCUTANEOUS NEPHROSTOMY     2007  due to pyelonephritis   RADICAL HYSTERECTOMY  04/28/2019   cervix removed has ovaries, removed 1 fallopian tube   WISDOM TOOTH EXTRACTION     x 4    Current Outpatient Medications  Medication Instructions   fluticasone (FLONASE) 50 MCG/ACT nasal spray 2 sprays, Each Nare, Daily   levocetirizine (XYZAL) 5 mg, Daily at bedtime   montelukast (SINGULAIR) 10 mg, Oral, Daily at bedtime   norethindrone (AYGESTIN) 5 mg, Daily   triamcinolone cream (KENALOG) 0.1 % 1 application , Topical, 2 times daily       Objective:   Physical Exam BP 126/80   Pulse 99   Temp 98.2 F (36.8 C) (Oral)   Resp 16   Ht 5\' 5"  (1.651 m)   Wt 157 lb 6 oz (71.4 kg)   LMP 12/18/2016 Comment: 04-28-2019  SpO2 97%   BMI 26.19 kg/m  General:   Well developed, NAD, BMI noted. HEENT:  Normocephalic . Face symmetric, atraumatic. Throat: Symmetric, not red, no white discharge. TMs: Obscured by wax, partially seen, but okay. Nose slightly congested, sinuses non-TTP Lungs:  CTA B Normal respiratory effort, no intercostal retractions, no accessory muscle use. Heart: RRR,  no murmur.  Lower extremities: no pretibial edema bilaterally  Skin: Not pale. Not jaundice Neurologic:  alert & oriented X3.  Speech normal,  gait appropriate for age and unassisted Psych--  Cognition and judgment appear intact.  Cooperative with normal attention span and concentration.  Behavior appropriate. No anxious or depressed appearing.      Assessment   38 year old female, PMH includes asthma, history of a DVT, radical hysterectomy, multiple urological procedures, low BMI, presents with  URI: Symptoms as described above. Flu and COVID test today: Negative Plan: Conservative treatment, I printed a prescription for Nutramax only to be taken if she is  getting worse.  See AVS Zithromax

## 2023-10-26 NOTE — Patient Instructions (Signed)
  Rest  Drink plenty of fluids  Tylenol  500 mg OTC 2 tabs a day every 8 hours as needed    For cough:  Take Mucinex DM or Robitussin-DM OTC.  Follow the instructions in the box.  Generics are okay.   For nasal congestion: -Use over-the-counter Flonase: 2 nasal sprays on each side of the nose in the morning until you feel better     Take the antibiotic as prescribed (Zithromax) only if you are not better in the next 5 to 6 days.  Call if not gradually better over the next  10 days   Call anytime if the symptoms are severe, you have high fever, short of breath, chest pain

## 2024-01-21 NOTE — Patient Instructions (Addendum)
 Good to see you again today!  I will be in touch with your labs asap  Have a wonderful summer Tetanus today- good for about 10 years Start colon cancer screening age 38 See you in a year unless you need me sooner!

## 2024-01-21 NOTE — Progress Notes (Signed)
 [facility] [address], [address] [place], [place] [address] [phone]  Date:  01/26/2024   Name:  [name]   DOB:  1985-12-28   MRN:  [id number]  PCP:  Kaylee Partridge, MD    Chief Complaint: Annual Exam   History of Present Illness:  [name] is a 38 y.o. very pleasant female patient who presents with the following:  Pt seen today for CPE Last seen by myself about one year ago   History of allergies, DVT 2007 due to immobilization and endometriosis, prediabetes, seasonal allergies  She had ureteral obstruction due to endometriosis in 2020, now recovered Her ureteral stents are now removed and she is doing well Obstruction was due to endometrioses  -not stones No more endometriosis sx - s/p hyst  GYN- DR Lesta Rater- will see her in 2 weeks  Married with 2 children ages 63 and 83  She works in accounts control with [employer] Never a smoker, rare etoh  Labs one year ago- she is fasting today  Mammo- there is a family history of breast cancer, on her father's side - older age however, pt reports per GYN no addnl testing recommended for her  No family history colon cancer- start screening 45 No CP or SOB   May be due for tetanus booster- will give today   Her seasonal allergies are under ok control Her eyes can be itchy- esp when she is outdoors a lot for her children's sports  She does see Dr Almeda Jacobs for allergies- they tried shots but did not keep it up due to tolerance issues   She tries to stay active, she does not do a lot of formal exercise    Patient Active Problem List   Diagnosis Date Noted   Pre-diabetes 12/24/2021   Endometriosis 01/09/2019   Hydroureter 01/09/2019   Bilateral hydronephrosis    Dysmenorrhea 02/13/2012   Allergic rhinitis 09/03/2009   Asthma 09/03/2009   Acute thromboembolism of deep veins of lower extremity (HCC) 07/27/2007   ADULT RESPIRATORY DISTRESS SYNDROME 07/27/2007     Past Medical History:  Diagnosis Date   Asthma    no inhaler used   Bilateral leg numbness 05/24/2007   Bronchitis, asthmatic    DVT (deep venous thrombosis), right 2007   Endometriosis    FHx: cancer    FHx: diabetes mellitus    FHx: heart disease    FHx: kidney disease    FHx: migraine headaches    FHx: stroke    FHx: thyroid  disease    H/O acute respiratory distress syndrome 2007   With a history of septic shock due to bladder infection   H/O dysmenorrhea 2005   H/O pyelonephritis    H/O septic shock 05/2006   H/O varicella    History of kidney stones    Hoarseness of voice    intermittent   Hx: UTI (urinary tract infection)    x 1   Low BMI 2007   During pregnancy   Postpartum care following vaginal delivery (3/28) 12/24/2013   Yeast infection     Past Surgical History:  Procedure Laterality Date   BALLOON DILATION Left 08/18/2019   Procedure: LEFT URETERAL  DILATION;  Surgeon: Andrez Banker, MD;  Location: [facility];  Service: Urology;  Laterality: Left;   CYSTOSCOPY WITH RETROGRADE PYELOGRAM, URETEROSCOPY AND STENT PLACEMENT Bilateral 08/18/2019   Procedure: CYSTOSCOPY WITH BILATERAL  RETROGRADE PYELOGRAM, LEFT  DIAGNOSTICURETEROSCOPY AND LEFT  STENT PLACEMENT;  Surgeon: Andrez Banker, MD;  Location: [facility];  Service: Urology;  Laterality: Bilateral;   PERCUTANEOUS NEPHROSTOMY     2007 due to pyelonephritis   RADICAL HYSTERECTOMY  04/28/2019   cervix removed has ovaries, removed 1 fallopian tube   WISDOM TOOTH EXTRACTION     x 4    Social History   Tobacco Use   Smoking status: Never   Smokeless tobacco: Never  Vaping Use   Vaping status: Never Used  Substance Use Topics   Alcohol use: Yes    Comment: occ   Drug use: No    Family History  Problem Relation Age of Onset   Diabetes Maternal Grandmother    Heart disease Maternal Grandmother        heart attack   COPD Maternal Grandmother         bronchitis   Hypertension Father    Hyperlipidemia Father    Diabetes Father    Fibroids Mother        had hyst   Hypertension Mother    Hyperlipidemia Mother    Diabetes Mother    Fibroids Paternal Aunt    Mental retardation Paternal Aunt    Cancer Paternal Grandfather        brain tumor   Hypertension Maternal Grandfather    Stroke Maternal Grandfather    Kidney disease Maternal Grandfather        dialysis    Allergies  Allergen Reactions   Amoxicillin Hives    Did it involve swelling of the face/tongue/throat, SOB, or low BP? No Did it involve sudden or severe rash/hives, skin peeling, or any reaction on the inside of your mouth or nose? No Did you need to seek medical attention at a hospital or doctor's office? No When did it last happen?      as a child If all above answers are "NO", may proceed with cephalosporin use.    Lactose Intolerance (Gi)     Medication list has been reviewed and updated.  Current Outpatient Medications on File Prior to Visit  Medication Sig Dispense Refill   fluticasone  (FLONASE ) 50 MCG/ACT nasal spray Place 2 sprays into both nostrils daily. 48 g 3   levocetirizine (XYZAL ) 5 MG tablet Take 5 mg by mouth at bedtime.      montelukast  (SINGULAIR ) 10 MG tablet Take 1 tablet (10 mg total) by mouth at bedtime. 90 tablet 3   norethindrone (AYGESTIN) 5 MG tablet Take 5 mg by mouth daily.     triamcinolone  cream (KENALOG ) 0.1 % Apply 1 application. topically 2 (two) times daily. 90 g 1   No current facility-administered medications on file prior to visit.    Review of Systems:  As per HPI- otherwise negative.   Physical Examination: Vitals:   01/26/24 0846  BP: 110/72  Pulse: 88  Temp: 98 F (36.7 C)  SpO2: 98%   Vitals:   01/26/24 0846  Weight: 154 lb 3.2 oz (69.9 kg)  Height: 5\' 5"  (1.651 m)   Body mass index is 25.66 kg/m. Ideal Body Weight: Weight in (lb) to have BMI = 25: 149.9  GEN: no acute distress. Normal weight, looks  well  HEENT: Atraumatic, Normocephalic.  Bilateral TM wnl, oropharynx normal.  PEERL,EOMI.   Ears and Nose: No external deformity. CV: RRR, No M/G/R. No JVD. No thrill. No extra heart sounds. PULM: CTA B, no wheezes, crackles, rhonchi. No retractions. No resp. distress. No accessory  muscle use. ABD: S, NT, ND, +BS. No rebound. No HSM. EXTR: No c/c/e PSYCH: Normally interactive. Conversant.    Assessment and Plan: Physical exam  Screening for deficiency anemia - Plan: CBC  Pre-diabetes - Plan: Comprehensive metabolic panel with GFR, Hemoglobin A1c  Screening for hyperlipidemia - Plan: Lipid panel  Screening for thyroid  disorder - Plan: TSH  Vitamin D  deficiency - Plan: VITAMIN D  25 Hydroxy (Vit-D Deficiency, Fractures)  Immunization due - Plan: Td vaccine greater than or equal to 7yo preservative free IM  Physical exam Recommend healthy diet and exercise routine Will plan further follow- up pending labs. Tetanus booster today   Signed Gates Kasal, MD  Received labs as below, message to patient  Results for orders placed or performed in visit on 01/26/24  CBC   Collection Time: 01/26/24  9:29 AM  Result Value Ref Range   WBC 7.0 4.0 - 10.5 K/uL   RBC 4.61 3.87 - 5.11 Mil/uL   Platelets 322.0 150.0 - 400.0 K/uL   Hemoglobin 13.3 12.0 - 15.0 g/dL   HCT 40.9 81.1 - 91.4 %   MCV 86.5 78.0 - 100.0 fl   MCHC 33.3 30.0 - 36.0 g/dL   RDW 78.2 95.6 - 21.3 %  Comprehensive metabolic panel with GFR   Collection Time: 01/26/24  9:29 AM  Result Value Ref Range   Sodium 139 135 - 145 mEq/L   Potassium 4.1 3.5 - 5.1 mEq/L   Chloride 106 96 - 112 mEq/L   CO2 25 19 - 32 mEq/L   Glucose, Bld 93 70 - 99 mg/dL   BUN 8 6 - 23 mg/dL   Creatinine, Ser 0.86 0.40 - 1.20 mg/dL   Total Bilirubin 0.5 0.2 - 1.2 mg/dL   Alkaline Phosphatase 75 39 - 117 U/L   AST 12 0 - 37 U/L   ALT 10 0 - 35 U/L   Total Protein 7.2 6.0 - 8.3 g/dL   Albumin 4.5 3.5 - 5.2 g/dL   GFR 578.46 >96.29  mL/min   Calcium 9.4 8.4 - 10.5 mg/dL  Hemoglobin B2W   Collection Time: 01/26/24  9:29 AM  Result Value Ref Range   Hgb A1c MFr Bld 5.9 4.6 - 6.5 %  Lipid panel   Collection Time: 01/26/24  9:29 AM  Result Value Ref Range   Cholesterol 143 0 - 200 mg/dL   Triglycerides 41.3 0.0 - 149.0 mg/dL   HDL 24.40 (L) >10.27 mg/dL   VLDL 9.2 0.0 - 25.3 mg/dL   LDL Cholesterol 664 (H) 0 - 99 mg/dL   Total CHOL/HDL Ratio 5    NonHDL 112.56   TSH   Collection Time: 01/26/24  9:29 AM  Result Value Ref Range   TSH 1.07 0.35 - 5.50 uIU/mL  VITAMIN D  25 Hydroxy (Vit-D Deficiency, Fractures)   Collection Time: 01/26/24  9:29 AM  Result Value Ref Range   VITD 25.36 (L) 30.00 - 100.00 ng/mL

## 2024-01-26 ENCOUNTER — Ambulatory Visit (INDEPENDENT_AMBULATORY_CARE_PROVIDER_SITE_OTHER): Payer: BC Managed Care – PPO | Admitting: Family Medicine

## 2024-01-26 ENCOUNTER — Encounter: Payer: Self-pay | Admitting: Family Medicine

## 2024-01-26 VITALS — BP 110/72 | HR 88 | Temp 98.0°F | Ht 65.0 in | Wt 154.2 lb

## 2024-01-26 DIAGNOSIS — Z1329 Encounter for screening for other suspected endocrine disorder: Secondary | ICD-10-CM | POA: Diagnosis not present

## 2024-01-26 DIAGNOSIS — Z23 Encounter for immunization: Secondary | ICD-10-CM | POA: Diagnosis not present

## 2024-01-26 DIAGNOSIS — Z13220 Encounter for screening for lipoid disorders: Secondary | ICD-10-CM

## 2024-01-26 DIAGNOSIS — R7303 Prediabetes: Secondary | ICD-10-CM

## 2024-01-26 DIAGNOSIS — Z0000 Encounter for general adult medical examination without abnormal findings: Secondary | ICD-10-CM | POA: Diagnosis not present

## 2024-01-26 DIAGNOSIS — E559 Vitamin D deficiency, unspecified: Secondary | ICD-10-CM

## 2024-01-26 DIAGNOSIS — Z130 Encounter for screening for diseases of the blood and blood-forming organs and certain disorders involving the immune mechanism: Secondary | ICD-10-CM

## 2024-01-26 LAB — COMPREHENSIVE METABOLIC PANEL WITH GFR
ALT: 10 U/L (ref 0–35)
AST: 12 U/L (ref 0–37)
Albumin: 4.5 g/dL (ref 3.5–5.2)
Alkaline Phosphatase: 75 U/L (ref 39–117)
BUN: 8 mg/dL (ref 6–23)
CO2: 25 meq/L (ref 19–32)
Calcium: 9.4 mg/dL (ref 8.4–10.5)
Chloride: 106 meq/L (ref 96–112)
Creatinine, Ser: 0.6 mg/dL (ref 0.40–1.20)
GFR: 112.6 mL/min (ref >60.00)
Glucose, Bld: 93 mg/dL (ref 70–99)
Potassium: 4.1 meq/L (ref 3.5–5.1)
Sodium: 139 meq/L (ref 135–145)
Total Bilirubin: 0.5 mg/dL (ref 0.2–1.2)
Total Protein: 7.2 g/dL (ref 6.0–8.3)

## 2024-01-26 LAB — VITAMIN D 25 HYDROXY (VIT D DEFICIENCY, FRACTURES): VITD: 25.36 ng/mL — ABNORMAL LOW (ref 30.00–100.00)

## 2024-01-26 LAB — LIPID PANEL
Cholesterol: 143 mg/dL (ref 0–200)
HDL: 30.2 mg/dL — ABNORMAL LOW (ref >39.00)
LDL Cholesterol: 103 mg/dL — ABNORMAL HIGH (ref 0–99)
NonHDL: 112.56
Total CHOL/HDL Ratio: 5
Triglycerides: 46 mg/dL (ref 0.0–149.0)
VLDL: 9.2 mg/dL (ref 0.0–40.0)

## 2024-01-26 LAB — CBC
HCT: 39.9 % (ref 36.0–46.0)
Hemoglobin: 13.3 g/dL (ref 12.0–15.0)
MCHC: 33.3 g/dL (ref 30.0–36.0)
MCV: 86.5 fl (ref 78.0–100.0)
Platelets: 322 10*3/uL (ref 150.0–400.0)
RBC: 4.61 Mil/uL (ref 3.87–5.11)
RDW: 13.6 % (ref 11.5–15.5)
WBC: 7 10*3/uL (ref 4.0–10.5)

## 2024-01-26 LAB — TSH: TSH: 1.07 u[IU]/mL (ref 0.35–5.50)

## 2024-01-26 LAB — HEMOGLOBIN A1C: Hgb A1c MFr Bld: 5.9 % (ref 4.6–6.5)

## 2024-02-10 DIAGNOSIS — Z01419 Encounter for gynecological examination (general) (routine) without abnormal findings: Secondary | ICD-10-CM | POA: Diagnosis not present

## 2024-02-10 DIAGNOSIS — B3731 Acute candidiasis of vulva and vagina: Secondary | ICD-10-CM | POA: Diagnosis not present

## 2024-02-10 DIAGNOSIS — Z1231 Encounter for screening mammogram for malignant neoplasm of breast: Secondary | ICD-10-CM | POA: Diagnosis not present

## 2024-02-18 DIAGNOSIS — N6341 Unspecified lump in right breast, subareolar: Secondary | ICD-10-CM | POA: Diagnosis not present

## 2024-02-18 DIAGNOSIS — N6041 Mammary duct ectasia of right breast: Secondary | ICD-10-CM | POA: Diagnosis not present

## 2024-02-18 DIAGNOSIS — R232 Flushing: Secondary | ICD-10-CM | POA: Diagnosis not present

## 2024-02-18 DIAGNOSIS — R61 Generalized hyperhidrosis: Secondary | ICD-10-CM | POA: Diagnosis not present

## 2024-03-15 ENCOUNTER — Other Ambulatory Visit: Payer: Self-pay | Admitting: Family Medicine

## 2024-03-15 DIAGNOSIS — J3089 Other allergic rhinitis: Secondary | ICD-10-CM

## 2024-04-19 DIAGNOSIS — J301 Allergic rhinitis due to pollen: Secondary | ICD-10-CM | POA: Diagnosis not present

## 2024-04-19 DIAGNOSIS — J3081 Allergic rhinitis due to animal (cat) (dog) hair and dander: Secondary | ICD-10-CM | POA: Diagnosis not present

## 2024-04-19 DIAGNOSIS — H1045 Other chronic allergic conjunctivitis: Secondary | ICD-10-CM | POA: Diagnosis not present

## 2024-04-19 DIAGNOSIS — J4520 Mild intermittent asthma, uncomplicated: Secondary | ICD-10-CM | POA: Diagnosis not present

## 2024-09-14 DIAGNOSIS — J3081 Allergic rhinitis due to animal (cat) (dog) hair and dander: Secondary | ICD-10-CM | POA: Diagnosis not present

## 2024-09-14 DIAGNOSIS — J4520 Mild intermittent asthma, uncomplicated: Secondary | ICD-10-CM | POA: Diagnosis not present

## 2024-09-14 DIAGNOSIS — J301 Allergic rhinitis due to pollen: Secondary | ICD-10-CM | POA: Diagnosis not present

## 2024-09-14 DIAGNOSIS — H1045 Other chronic allergic conjunctivitis: Secondary | ICD-10-CM | POA: Diagnosis not present

## 2025-01-31 ENCOUNTER — Encounter: Admitting: Family Medicine
